# Patient Record
Sex: Female | Born: 1960 | Race: White | Hispanic: No | Marital: Married | Smoking: Never smoker
Health system: Southern US, Community
[De-identification: ages and names within clinical notes are randomized; demographics above are authoritative.]

---

## 1998-01-17 ENCOUNTER — Ambulatory Visit (HOSPITAL_COMMUNITY): Admission: RE | Admit: 1998-01-17 | Discharge: 1998-01-17 | Payer: Self-pay

## 2010-07-22 DIAGNOSIS — I10 Essential (primary) hypertension: Secondary | ICD-10-CM | POA: Insufficient documentation

## 2011-11-06 DIAGNOSIS — F32A Depression, unspecified: Secondary | ICD-10-CM | POA: Insufficient documentation

## 2012-05-26 DIAGNOSIS — E119 Type 2 diabetes mellitus without complications: Secondary | ICD-10-CM | POA: Insufficient documentation

## 2012-11-11 DIAGNOSIS — M1990 Unspecified osteoarthritis, unspecified site: Secondary | ICD-10-CM | POA: Insufficient documentation

## 2012-11-11 DIAGNOSIS — G4733 Obstructive sleep apnea (adult) (pediatric): Secondary | ICD-10-CM | POA: Insufficient documentation

## 2012-11-11 DIAGNOSIS — M797 Fibromyalgia: Secondary | ICD-10-CM | POA: Insufficient documentation

## 2012-11-11 DIAGNOSIS — F411 Generalized anxiety disorder: Secondary | ICD-10-CM | POA: Insufficient documentation

## 2012-11-11 DIAGNOSIS — E785 Hyperlipidemia, unspecified: Secondary | ICD-10-CM | POA: Insufficient documentation

## 2012-11-11 DIAGNOSIS — M519 Unspecified thoracic, thoracolumbar and lumbosacral intervertebral disc disorder: Secondary | ICD-10-CM | POA: Insufficient documentation

## 2012-12-23 DIAGNOSIS — G5600 Carpal tunnel syndrome, unspecified upper limb: Secondary | ICD-10-CM | POA: Insufficient documentation

## 2012-12-23 DIAGNOSIS — M542 Cervicalgia: Secondary | ICD-10-CM | POA: Insufficient documentation

## 2013-01-14 DIAGNOSIS — F410 Panic disorder [episodic paroxysmal anxiety] without agoraphobia: Secondary | ICD-10-CM | POA: Insufficient documentation

## 2013-05-13 DIAGNOSIS — S93609A Unspecified sprain of unspecified foot, initial encounter: Secondary | ICD-10-CM | POA: Insufficient documentation

## 2013-08-10 DIAGNOSIS — M25511 Pain in right shoulder: Secondary | ICD-10-CM | POA: Insufficient documentation

## 2013-08-10 DIAGNOSIS — G43909 Migraine, unspecified, not intractable, without status migrainosus: Secondary | ICD-10-CM | POA: Insufficient documentation

## 2013-08-10 DIAGNOSIS — Z79899 Other long term (current) drug therapy: Secondary | ICD-10-CM | POA: Insufficient documentation

## 2015-06-14 DIAGNOSIS — Z9884 Bariatric surgery status: Secondary | ICD-10-CM | POA: Insufficient documentation

## 2015-08-09 DIAGNOSIS — F339 Major depressive disorder, recurrent, unspecified: Secondary | ICD-10-CM | POA: Insufficient documentation

## 2016-09-17 DIAGNOSIS — R300 Dysuria: Secondary | ICD-10-CM | POA: Insufficient documentation

## 2017-02-27 DIAGNOSIS — Z9884 Bariatric surgery status: Secondary | ICD-10-CM | POA: Insufficient documentation

## 2019-05-26 DIAGNOSIS — Z0289 Encounter for other administrative examinations: Secondary | ICD-10-CM | POA: Insufficient documentation

## 2020-03-27 DIAGNOSIS — N281 Cyst of kidney, acquired: Secondary | ICD-10-CM | POA: Insufficient documentation

## 2020-03-27 DIAGNOSIS — N200 Calculus of kidney: Secondary | ICD-10-CM | POA: Insufficient documentation

## 2020-11-06 DIAGNOSIS — M7551 Bursitis of right shoulder: Secondary | ICD-10-CM | POA: Insufficient documentation

## 2021-02-27 DIAGNOSIS — Z2821 Immunization not carried out because of patient refusal: Secondary | ICD-10-CM | POA: Insufficient documentation

## 2021-07-18 ENCOUNTER — Other Ambulatory Visit: Payer: Self-pay

## 2021-07-18 ENCOUNTER — Telehealth (INDEPENDENT_AMBULATORY_CARE_PROVIDER_SITE_OTHER): Payer: Medicare HMO | Admitting: Family Medicine

## 2021-07-18 DIAGNOSIS — G4700 Insomnia, unspecified: Secondary | ICD-10-CM

## 2021-07-18 DIAGNOSIS — Z7689 Persons encountering health services in other specified circumstances: Secondary | ICD-10-CM | POA: Diagnosis not present

## 2021-07-18 MED ORDER — NORTRIPTYLINE HCL 25 MG PO CAPS: 25.0000 mg | ORAL_CAPSULE | Freq: Every day | ORAL | 1 refills | Status: DC

## 2021-07-18 NOTE — Progress Notes (Signed)
Virtual Visit via Telephone Note  I connected with Sudan Sweetser on 07/18/21 at  1:00 PM EST by telephone and verified that I am speaking with the correct person using two identifiers.  Location: Patient: office Provider: [place]   I discussed the limitations, risks, security and privacy concerns of performing an evaluation and management service by telephone and the availability of in person appointments. I also discussed with the patient that there may be a patient responsible charge related to this service. The patient expressed understanding and agreed to proceed.   History of Present Illness: Patient wants to establish care. Patient also wants refills of nortriptyline  which she takes nightly for sleep.    Observations/Objective:   Assessment and Plan: 1. Insomnia, unspecified type Nortriptyline was refilled.   2. Encounter to establish care   Follow Up Instructions: Patient to schedule for a face-to-face visit.    I discussed the assessment and treatment plan with the patient. The patient was provided an opportunity to ask questions and all were answered. The patient agreed with the plan and demonstrated an understanding of the instructions.   The patient was advised to call back or seek an in-person evaluation if the symptoms worsen or if the condition fails to improve as anticipated.  I provided 5 minutes of non-face-to-face time during this encounter.   Tommie Raymond, MD

## 2021-08-20 ENCOUNTER — Encounter (INDEPENDENT_AMBULATORY_CARE_PROVIDER_SITE_OTHER): Payer: Self-pay

## 2021-08-20 ENCOUNTER — Ambulatory Visit (INDEPENDENT_AMBULATORY_CARE_PROVIDER_SITE_OTHER): Payer: Medicare HMO | Admitting: Family Medicine

## 2021-08-20 ENCOUNTER — Other Ambulatory Visit: Payer: Self-pay

## 2021-08-20 VITALS — BP 138/81 | HR 65 | Temp 99.2°F | Resp 16 | Ht 66.0 in | Wt 250.0 lb

## 2021-08-20 DIAGNOSIS — B349 Viral infection, unspecified: Secondary | ICD-10-CM

## 2021-08-20 DIAGNOSIS — R109 Unspecified abdominal pain: Secondary | ICD-10-CM

## 2021-08-20 DIAGNOSIS — G4733 Obstructive sleep apnea (adult) (pediatric): Secondary | ICD-10-CM | POA: Diagnosis not present

## 2021-08-20 DIAGNOSIS — K219 Gastro-esophageal reflux disease without esophagitis: Secondary | ICD-10-CM

## 2021-08-20 DIAGNOSIS — N23 Unspecified renal colic: Secondary | ICD-10-CM

## 2021-08-20 DIAGNOSIS — I10 Essential (primary) hypertension: Secondary | ICD-10-CM

## 2021-08-20 LAB — POCT URINALYSIS DIP (CLINITEK)
Bilirubin, UA: NEGATIVE
Blood, UA: NEGATIVE
Glucose, UA: NEGATIVE mg/dL
Ketones, POC UA: NEGATIVE mg/dL
Nitrite, UA: NEGATIVE
POC PROTEIN,UA: NEGATIVE
Spec Grav, UA: 1.01 (ref 1.010–1.025)
Urobilinogen, UA: 1 E.U./dL
pH, UA: 6.5 (ref 5.0–8.0)

## 2021-08-20 MED ORDER — DICLOFENAC SODIUM 1 % EX GEL: 2.0000 g | Freq: Four times a day (QID) | CUTANEOUS | 1 refills | Status: DC

## 2021-08-20 MED ORDER — LOSARTAN POTASSIUM 100 MG PO TABS: 100.0000 mg | ORAL_TABLET | Freq: Every day | ORAL | 1 refills | Status: DC

## 2021-08-20 MED ORDER — AMLODIPINE BESYLATE 10 MG PO TABS: 10.0000 mg | ORAL_TABLET | Freq: Every day | ORAL | 1 refills | Status: DC

## 2021-08-20 MED ORDER — METOPROLOL TARTRATE 50 MG PO TABS: 50.0000 mg | ORAL_TABLET | Freq: Two times a day (BID) | ORAL | 1 refills | Status: DC

## 2021-08-20 MED ORDER — OMEPRAZOLE 20 MG PO CPDR: 20.0000 mg | DELAYED_RELEASE_CAPSULE | Freq: Every day | ORAL | 1 refills | Status: DC

## 2021-08-20 NOTE — Progress Notes (Signed)
Patient is here to establish care.  Patient c/o  throat pain,cough, tail bone pain, ear pain, and ear pain 7/10

## 2021-08-20 NOTE — Progress Notes (Signed)
Established Patient Office Visit  Subjective:  Patient ID: Laura Reid, female    DOB: 01/15/1961  Age: 61 y.o. MRN: [id number]  CC:  Chief Complaint  Patient presents with   Establish Care    HPI [facility] presents for complaint of cough, congestion and various viral sx. Denies known contacts or exposures. Patient also is in for follow up of chronic med issues with med refills.   No past medical history on file.  No family history on file.  Social History   Socioeconomic History   Marital status: Unknown    Spouse name: Not on file   Number of children: Not on file   Years of education: Not on file   Highest education level: Not on file  Occupational History   Not on file  Tobacco Use   Smoking status: Not on file   Smokeless tobacco: Not on file  Substance and Sexual Activity   Alcohol use: Not on file   Drug use: Not on file   Sexual activity: Not on file  Other Topics Concern   Not on file  Social History Narrative   Not on file   Social Determinants of Health   Financial Resource Strain: Not on file  Food Insecurity: Not on file  Transportation Needs: Not on file  Physical Activity: Not on file  Stress: Not on file  Social Connections: Not on file  Intimate Partner Violence: Not on file    ROS Review of Systems  Constitutional:  Positive for fatigue and fever. Negative for chills.  HENT:  Positive for congestion, ear pain and rhinorrhea.   Respiratory:  Positive for cough. Negative for shortness of breath.   Neurological:  Positive for headaches.  All other systems reviewed and are negative.  Objective:   Today's Vitals: BP 138/81    Pulse 65    Temp 99.2 F (37.3 C) (Oral)    Resp 16    Ht 5\' 6"  (1.676 m)    Wt 250 lb (113.4 kg)    SpO2 95%    BMI 40.35 kg/m   Physical Exam Vitals and nursing note reviewed.  Constitutional:      General: She is not in acute distress.    Appearance: She is obese.  HENT:     Head: Normocephalic.      Right Ear: Tympanic membrane normal.     Left Ear: Tympanic membrane normal.     Mouth/Throat:     Mouth: Mucous membranes are moist.  Eyes:     Conjunctiva/sclera: Conjunctivae normal.  Cardiovascular:     Rate and Rhythm: Normal rate and regular rhythm.  Pulmonary:     Effort: Pulmonary effort is normal.     Breath sounds: Normal breath sounds.  Abdominal:     Palpations: Abdomen is soft.     Tenderness: There is no abdominal tenderness.  Musculoskeletal:     Right lower leg: No edema.     Left lower leg: No edema.  Neurological:     General: No focal deficit present.     Mental Status: She is alert and oriented to person, place, and time.    Assessment & Plan:   1. Viral syndrome Results of viral testing pending.   - COVID-19, Flu A+B and RSV  2. Essential hypertension Continue present management and monitor. Meds refilled.   - amLODipine (NORVASC) 10 MG tablet; Take 1 tablet (10 mg total) by mouth daily.  Dispense: 90 tablet; Refill: 1 - losartan (COZAAR)  100 MG tablet; Take 1 tablet (100 mg total) by mouth daily.  Dispense: 90 tablet; Refill: 1 - metoprolol tartrate (LOPRESSOR) 50 MG tablet; Take 1 tablet (50 mg total) by mouth 2 (two) times daily.  Dispense: 180 tablet; Refill: 1  3. Gastroesophageal reflux disease without esophagitis Continue present management. Meds refilled.  - omeprazole (PRILOSEC) 20 MG capsule; Take 1 capsule (20 mg total) by mouth daily.  Dispense: 90 capsule; Refill: 1  4. OSA (obstructive sleep apnea) Patient given form for change in cpap setting.   - For home use only DME continuous positive airway pressure (CPAP)  5. Flank pain Urine clinically unremarkable. Recommend adequate fluids .  - POCT URINALYSIS DIP (CLINITEK)    Outpatient Encounter Medications as of 08/20/2021  Medication Sig   albuterol (VENTOLIN HFA) 108 (90 Base) MCG/ACT inhaler Inhale into the lungs.   gabapentin (NEURONTIN) 300 MG capsule Take by mouth.    Multiple Vitamin (MULTI-VITAMINS) TABS Take 1 tablet by mouth daily.   nortriptyline (PAMELOR) 25 MG capsule Take 1 capsule (25 mg total) by mouth at bedtime.   ondansetron (ZOFRAN-ODT) 4 MG disintegrating tablet Take by mouth.   oxyCODONE (ROXICODONE) 5 MG/5ML solution Take by mouth.   PARoxetine (PAXIL) 30 MG tablet Take by mouth.   tiZANidine (ZANAFLEX) 4 MG tablet Take 1/2 to 1 tablet up to three times daily as needed for muscle spasms   [DISCONTINUED] amLODipine (NORVASC) 10 MG tablet Take 1 tablet by mouth daily.   [DISCONTINUED] diclofenac Sodium (VOLTAREN) 1 % GEL Apply topically.   [DISCONTINUED] losartan (COZAAR) 100 MG tablet Take 1 tablet by mouth daily.   [DISCONTINUED] metoprolol tartrate (LOPRESSOR) 50 MG tablet Take by mouth.   [DISCONTINUED] omeprazole (PRILOSEC) 20 MG capsule Take 1 tablet by mouth daily.   amLODipine (NORVASC) 10 MG tablet Take 1 tablet (10 mg total) by mouth daily.   diclofenac Sodium (VOLTAREN) 1 % GEL Apply 2 g topically 4 (four) times daily.   losartan (COZAAR) 100 MG tablet Take 1 tablet (100 mg total) by mouth daily.   metoprolol tartrate (LOPRESSOR) 50 MG tablet Take 1 tablet (50 mg total) by mouth 2 (two) times daily.   omeprazole (PRILOSEC) 20 MG capsule Take 1 capsule (20 mg total) by mouth daily.   No facility-administered encounter medications on file as of 08/20/2021.    Follow-up: No follow-ups on file.   Tommie Raymond, MD

## 2021-08-21 ENCOUNTER — Encounter: Payer: Self-pay | Admitting: Family Medicine

## 2021-08-21 LAB — COVID-19, FLU A+B AND RSV
Influenza A, NAA: NOT DETECTED
Influenza B, NAA: NOT DETECTED
RSV, NAA: NOT DETECTED
SARS-CoV-2, NAA: DETECTED — AB

## 2021-08-22 ENCOUNTER — Other Ambulatory Visit: Payer: Self-pay | Admitting: Family Medicine

## 2021-08-22 MED ORDER — NIRMATRELVIR/RITONAVIR (PAXLOVID) TABLET (RENAL DOSING): 2.0000 | ORAL_TABLET | Freq: Two times a day (BID) | ORAL | 0 refills | Status: AC

## 2021-08-28 ENCOUNTER — Ambulatory Visit: Payer: Self-pay | Admitting: Family Medicine

## 2021-09-26 ENCOUNTER — Other Ambulatory Visit: Payer: Self-pay

## 2021-09-26 ENCOUNTER — Encounter: Payer: Self-pay | Admitting: Family Medicine

## 2021-09-26 ENCOUNTER — Ambulatory Visit (INDEPENDENT_AMBULATORY_CARE_PROVIDER_SITE_OTHER): Payer: Medicare HMO | Admitting: Family Medicine

## 2021-09-26 VITALS — BP 138/88 | HR 64 | Temp 98.1°F | Resp 16 | Wt 254.0 lb

## 2021-09-26 DIAGNOSIS — B349 Viral infection, unspecified: Secondary | ICD-10-CM | POA: Diagnosis not present

## 2021-09-26 DIAGNOSIS — R0789 Other chest pain: Secondary | ICD-10-CM | POA: Diagnosis not present

## 2021-09-26 DIAGNOSIS — K219 Gastro-esophageal reflux disease without esophagitis: Secondary | ICD-10-CM | POA: Diagnosis not present

## 2021-09-26 MED ORDER — OMEPRAZOLE 20 MG PO CPDR: 20.0000 mg | DELAYED_RELEASE_CAPSULE | Freq: Every day | ORAL | 1 refills | Status: DC

## 2021-09-26 MED ORDER — ALBUTEROL SULFATE HFA 108 (90 BASE) MCG/ACT IN AERS: 2.0000 | INHALATION_SPRAY | Freq: Four times a day (QID) | RESPIRATORY_TRACT | 3 refills | Status: DC | PRN

## 2021-09-26 MED ORDER — HYDROCHLOROTHIAZIDE 25 MG PO TABS: 25.0000 mg | ORAL_TABLET | Freq: Every day | ORAL | Status: DC

## 2021-09-26 NOTE — Progress Notes (Signed)
Patient is here for BP issues and a lingering cough since COVID  last month Patient had vomiting and wicked headache  Patient has chest pressure 1 day. EKG done

## 2021-09-26 NOTE — Progress Notes (Signed)
Established Patient Office Visit  Subjective:  Patient ID: Laura Reid, female    DOB: [birth year]  Age: 61 y.o. MRN: [id number]  CC:  Chief Complaint  Patient presents with   Sore Throat   Chest Pain    HPI Laura Reid presents for complaint of several symptoms similar to when she had covid last month including headache and cough. She denies fever but does report chills. She denies known contacts or exposures.   No past medical history on file.  No past surgical history on file.  No family history on file.  Social History   Socioeconomic History   Marital status: Married    Spouse name: Not on file   Number of children: Not on file   Years of education: Not on file   Highest education level: Not on file  Occupational History   Not on file  Tobacco Use   Smoking status: Not on file   Smokeless tobacco: Not on file  Substance and Sexual Activity   Alcohol use: Not on file   Drug use: Not on file   Sexual activity: Not on file  Other Topics Concern   Not on file  Social History Narrative   Not on file   Social Determinants of Health   Financial Resource Strain: Not on file  Food Insecurity: Not on file  Transportation Needs: Not on file  Physical Activity: Not on file  Stress: Not on file  Social Connections: Not on file  Intimate Partner Violence: Not on file    ROS Review of Systems  Constitutional:  Positive for chills. Negative for fever.  HENT:  Positive for sore throat.   Gastrointestinal:  Positive for nausea and vomiting.  Neurological:  Positive for headaches.  All other systems reviewed and are negative.  Objective:   Today's Vitals: BP 138/88    Pulse 64    Temp 98.1 F (36.7 C) (Oral)    Resp 16    Wt 254 lb (115.2 kg)    SpO2 96%    BMI 41.00 kg/m   Physical Exam Vitals and nursing note reviewed.  Constitutional:      General: She is not in acute distress.    Appearance: She is obese.  HENT:     Head: Normocephalic.      Right Ear: Tympanic membrane normal.     Left Ear: Tympanic membrane normal.     Mouth/Throat:     Mouth: Mucous membranes are moist.  Eyes:     Conjunctiva/sclera: Conjunctivae normal.  Cardiovascular:     Rate and Rhythm: Normal rate and regular rhythm.  Pulmonary:     Effort: Pulmonary effort is normal.     Breath sounds: Normal breath sounds.  Abdominal:     Palpations: Abdomen is soft.     Tenderness: There is no abdominal tenderness.  Musculoskeletal:     Right lower leg: No edema.     Left lower leg: No edema.  Neurological:     General: No focal deficit present.     Mental Status: She is alert and oriented to person, place, and time.    Assessment & Plan:   1. Viral syndrome Viral testing ordered - COVID-19, Flu A+B and RSV  2. Chest pressure EKG  with acute changes - EKG 12-Lead  3. Gastroesophageal reflux disease without esophagitis Meds refilled - omeprazole (PRILOSEC) 20 MG capsule; Take 1 capsule (20 mg total) by mouth daily.  Dispense: 90 capsule; Refill: 1  Outpatient Encounter Medications as of 09/26/2021  Medication Sig   albuterol (VENTOLIN HFA) 108 (90 Base) MCG/ACT inhaler Inhale into the lungs.   amLODipine (NORVASC) 10 MG tablet Take 1 tablet (10 mg total) by mouth daily.   diclofenac Sodium (VOLTAREN) 1 % GEL Apply 2 g topically 4 (four) times daily.   gabapentin (NEURONTIN) 300 MG capsule Take by mouth.   losartan (COZAAR) 100 MG tablet Take 1 tablet (100 mg total) by mouth daily.   metoprolol tartrate (LOPRESSOR) 50 MG tablet Take 1 tablet (50 mg total) by mouth 2 (two) times daily.   Multiple Vitamin (MULTI-VITAMINS) TABS Take 1 tablet by mouth daily.   nortriptyline (PAMELOR) 25 MG capsule Take 1 capsule (25 mg total) by mouth at bedtime.   omeprazole (PRILOSEC) 20 MG capsule Take 1 capsule (20 mg total) by mouth daily.   ondansetron (ZOFRAN-ODT) 4 MG disintegrating tablet Take by mouth.   oxyCODONE (ROXICODONE) 5 MG/5ML solution Take by  mouth.   PARoxetine (PAXIL) 30 MG tablet Take by mouth.   tiZANidine (ZANAFLEX) 4 MG tablet Take 1/2 to 1 tablet up to three times daily as needed for muscle spasms   No facility-administered encounter medications on file as of 09/26/2021.    Follow-up: No follow-ups on file.   Tommie Raymond, MD

## 2021-09-27 ENCOUNTER — Encounter: Payer: Self-pay | Admitting: Family Medicine

## 2021-09-27 LAB — COVID-19, FLU A+B AND RSV
Influenza A, NAA: NOT DETECTED
Influenza B, NAA: NOT DETECTED
RSV, NAA: NOT DETECTED
SARS-CoV-2, NAA: NOT DETECTED

## 2021-10-01 NOTE — Telephone Encounter (Signed)
Patient is aware of lab results.

## 2021-11-25 ENCOUNTER — Ambulatory Visit: Payer: Self-pay

## 2021-11-25 ENCOUNTER — Telehealth: Payer: Self-pay | Admitting: Family Medicine

## 2021-11-25 DIAGNOSIS — I10 Essential (primary) hypertension: Secondary | ICD-10-CM

## 2021-11-25 MED ORDER — METOPROLOL TARTRATE 50 MG PO TABS: 50.0000 mg | ORAL_TABLET | Freq: Two times a day (BID) | ORAL | 0 refills | Status: DC

## 2021-11-25 NOTE — Telephone Encounter (Signed)
?  Chief Complaint: med refill ?Symptoms: NA ?Frequency: NA ?Pertinent Negatives: NA ?Disposition: [] ED /[] Urgent Care (no appt availability in office) / [] Appointment(In office/virtual)/ []  [place] [facility]/ [] Home Care/ [] Refused Recommended Disposition /[] [facility]/ [x]  Follow-up with PCP ?Additional Notes: spoke with pt's husband. He states pt is needing refill on Paxil and metoprolol. I advised him I would send both of these medications to Dr. . I advised him someone should f/up tomorrow regarding meds being refilled. He verbalized understanding.  ? ?Summary: Refill Advice not with Dr  ? Pts husband is calling to request Dr. to refill the pts PARoxetine (PAXIL) 30 MG tablet - However Dr. is not the original prescriber. An appt is Scheduled for 01/02/22. Pt reports only has 3 pills left. Husband would like to know how the patient can receive more medication please advise?   ?  ? ?Reason for Disposition ? [1] Prescription refill request for NON-ESSENTIAL medicine (i.e., no harm to patient if med not taken) AND [2] triager unable to refill per department policy ? ?Answer Assessment - Initial Assessment Questions ?1. DRUG NAME: "What medicine do you need to have refilled?" ?    Paxil 30mg  and Metoprolol 50mg  ?2. REFILLS REMAINING: "How many refills are remaining?" (Note: The label on the medicine or pill bottle will show how many refills are remaining. If there are no refills remaining, then a renewal may be needed.) ?    0 ?4. PRESCRIBING HCP: "Who prescribed it?" Reason: If prescribed by specialist, call should be referred to that group. ?    Dr. Andrey Campanile ? ?Protocols used: Medication Refill and Renewal Call-A-AH ? ?

## 2021-11-25 NOTE — Telephone Encounter (Signed)
Requested Prescriptions  ?Pending Prescriptions Disp Refills  ?? metoprolol tartrate (LOPRESSOR) 50 MG tablet 180 tablet 1  ?  Sig: Take 1 tablet (50 mg total) by mouth 2 (two) times daily.  ?  ? Cardiovascular:  Beta Blockers Passed - 11/25/2021  5:50 PM  ?  ?  Passed - Last BP in normal range  ?  BP Readings from Last 1 Encounters:  ?09/26/21 138/88  ?   ?  ?  Passed - Last Heart Rate in normal range  ?  Pulse Readings from Last 1 Encounters:  ?09/26/21 64  ?   ?  ?  Passed - Valid encounter within last 6 months  ?  Recent Outpatient Visits   ?      ? 2 months ago Viral syndrome  ? Primary Care at [facility], [place]  ? 3 months ago Viral syndrome  ? Primary Care at [facility], MD  ? 4 months ago Insomnia, unspecified type  ? Primary Care at [facility], MD  ?  ?  ?Future Appointments   ?        ? In 1 month Georganna Skeans, MD Primary Care at [facility]  ?  ? ?  ?  ?  ?? PARoxetine (PAXIL) 30 MG tablet    ?  Sig: Take by mouth.  ?  ? Psychiatry:  Antidepressants - SSRI Passed - 11/25/2021  5:50 PM  ?  ?  Passed - Valid encounter within last 6 months  ?  Recent Outpatient Visits   ?      ? 2 months ago Viral syndrome  ? Primary Care at [facility], [place]  ? 3 months ago Viral syndrome  ? Primary Care at [facility], MD  ? 4 months ago Insomnia, unspecified type  ? Primary Care at [facility], MD  ?  ?  ?Future Appointments   ?        ? In 1 month Georganna Skeans, MD Primary Care at [facility]  ?  ? ?  ?  ?  ? ? ?

## 2021-11-25 NOTE — Telephone Encounter (Signed)
Requested medication (s) are due for refill today: yes ? ?Requested medication (s) are on the active medication list: yes ? ?Last refill:  07/19/20 ? ?Future visit scheduled: yes ? ?Notes to clinic:  historical med. pt requesting refill. She only has 2 pills left. Thought this med was prescribed by Dr. Andrey Campanile previously.  ? ? ?  ?Requested Prescriptions  ?Pending Prescriptions Disp Refills  ? PARoxetine (PAXIL) 30 MG tablet    ?  Sig: Take by mouth.  ?  ? Psychiatry:  Antidepressants - SSRI Passed - 11/25/2021  5:50 PM  ?  ?  Passed - Valid encounter within last 6 months  ?  Recent Outpatient Visits   ? ?      ? 2 months ago Viral syndrome  ? Primary Care at [facility], MD  ? 3 months ago Viral syndrome  ? Primary Care at [facility], [place]  ? 4 months ago Insomnia, unspecified type  ? Primary Care at [facility], MD  ? ?  ?  ?Future Appointments   ? ?        ? In 1 month Georganna Skeans, MD Primary Care at [facility]  ? ?  ? ? ?  ?  ?  ?Signed Prescriptions Disp Refills  ? metoprolol tartrate (LOPRESSOR) 50 MG tablet 180 tablet 0  ?  Sig: Take 1 tablet (50 mg total) by mouth 2 (two) times daily.  ?  ? Cardiovascular:  Beta Blockers Passed - 11/25/2021  5:50 PM  ?  ?  Passed - Last BP in normal range  ?  BP Readings from Last 1 Encounters:  ?09/26/21 138/88  ?  ?  ?  ?  Passed - Last Heart Rate in normal range  ?  Pulse Readings from Last 1 Encounters:  ?09/26/21 64  ?  ?  ?  ?  Passed - Valid encounter within last 6 months  ?  Recent Outpatient Visits   ? ?      ? 2 months ago Viral syndrome  ? Primary Care at [facility], MD  ? 3 months ago Viral syndrome  ? Primary Care at [facility], MD  ? 4 months ago Insomnia, unspecified type  ? Primary Care at [facility], MD  ? ?  ?  ?Future Appointments   ? ?        ? In 1 month Georganna Skeans, MD Primary Care at [facility]  ? ?  ? ? ?  ?  ?  ? ?

## 2021-11-25 NOTE — Telephone Encounter (Signed)
Medication Refill - Medication: metoprolol tartrate (LOPRESSOR) 50 MG tablet [[id number]] ,  ? ?Has the patient contacted their pharmacy? Yes.   ?(Agent: If no, request that the patient contact the pharmacy for the refill. If patient does not wish to contact the pharmacy document the reason why and proceed with request.) ?(Agent: If yes, when and what did the pharmacy advise?) ? ?Preferred Pharmacy (with phone number or street name):  ?[id number] - [place], [place] - [address]  ?[address] [place] [address]  ?Phone: [phone] Fax: [phone]  ?Hours: Not open 24 hours  ? ? ? ?Has the patient been seen for an appointment in the last year OR does the patient have an upcoming appointment? Yes.   ? ?Agent: Please be advised that RX refills may take up to 3 business days. We ask that you follow-up with your pharmacy. ? ?

## 2021-11-26 ENCOUNTER — Other Ambulatory Visit: Payer: Self-pay | Admitting: *Deleted

## 2021-11-29 ENCOUNTER — Other Ambulatory Visit: Payer: Self-pay | Admitting: Family Medicine

## 2021-12-02 ENCOUNTER — Other Ambulatory Visit: Payer: Self-pay | Admitting: Family Medicine

## 2021-12-02 NOTE — Telephone Encounter (Signed)
Pt stated this was the wrong dose, pt stated she is supposed to get 100MG  tablets, extended release, please advise.  ?

## 2021-12-04 ENCOUNTER — Other Ambulatory Visit: Payer: Self-pay | Admitting: Family Medicine

## 2021-12-04 DIAGNOSIS — I10 Essential (primary) hypertension: Secondary | ICD-10-CM

## 2021-12-04 MED ORDER — METOPROLOL SUCCINATE ER 100 MG PO TB24: 100.0000 mg | ORAL_TABLET | Freq: Every day | ORAL | 1 refills | Status: DC

## 2021-12-11 ENCOUNTER — Encounter: Payer: Self-pay | Admitting: Family Medicine

## 2021-12-11 ENCOUNTER — Telehealth: Payer: Self-pay | Admitting: Family Medicine

## 2021-12-11 ENCOUNTER — Other Ambulatory Visit: Payer: Self-pay | Admitting: Family Medicine

## 2021-12-11 MED ORDER — PAROXETINE HCL 30 MG PO TABS: 30.0000 mg | ORAL_TABLET | Freq: Every day | ORAL | 0 refills | Status: DC

## 2021-12-11 NOTE — Telephone Encounter (Signed)
I had called pt to r/s her appt on the afternoon of May 25. Moved it to the morning of same day. She said that she is out of her Paxil and has already requested a refill. She was a prior pt of Dr. Tawana Scale in [facility] and is unsure why there are notations that Dr. Andrey Campanile never filled this for her. Please refill until she is seen on May 25.  [facility]. ?

## 2021-12-18 ENCOUNTER — Telehealth: Payer: Self-pay | Admitting: Family Medicine

## 2021-12-18 NOTE — Telephone Encounter (Signed)
Pt is suppose to be taking 60 MG and not 30mg / PARoxetine (PAXIL) 30 MG tablet /pt was only sent 30 tabs to get her to her 5.25.23 appt / pt needs enough to get her to that date and did not received 26  pills from the pharmacy since the pharmacy gave her a few as courtesy while waiting for refill / please advise  ?

## 2021-12-19 ENCOUNTER — Other Ambulatory Visit: Payer: Self-pay | Admitting: Family Medicine

## 2021-12-19 MED ORDER — PAROXETINE HCL 30 MG PO TABS: 60.0000 mg | ORAL_TABLET | Freq: Every day | ORAL | 1 refills | Status: DC

## 2021-12-23 ENCOUNTER — Encounter: Payer: Self-pay | Admitting: Family Medicine

## 2022-01-02 ENCOUNTER — Ambulatory Visit (INDEPENDENT_AMBULATORY_CARE_PROVIDER_SITE_OTHER): Payer: Medicare HMO | Admitting: Family Medicine

## 2022-01-02 ENCOUNTER — Encounter: Payer: Self-pay | Admitting: Family Medicine

## 2022-01-02 VITALS — BP 137/80 | HR 57 | Temp 98.0°F | Resp 16 | Wt 255.1 lb

## 2022-01-02 DIAGNOSIS — J4541 Moderate persistent asthma with (acute) exacerbation: Secondary | ICD-10-CM

## 2022-01-02 DIAGNOSIS — I10 Essential (primary) hypertension: Secondary | ICD-10-CM

## 2022-01-02 DIAGNOSIS — K219 Gastro-esophageal reflux disease without esophagitis: Secondary | ICD-10-CM

## 2022-01-02 DIAGNOSIS — R519 Headache, unspecified: Secondary | ICD-10-CM | POA: Diagnosis not present

## 2022-01-02 DIAGNOSIS — R296 Repeated falls: Secondary | ICD-10-CM

## 2022-01-02 MED ORDER — AMLODIPINE BESYLATE 10 MG PO TABS: 10.0000 mg | ORAL_TABLET | Freq: Every day | ORAL | 1 refills | Status: DC

## 2022-01-02 MED ORDER — METOPROLOL SUCCINATE ER 100 MG PO TB24: 100.0000 mg | ORAL_TABLET | Freq: Every day | ORAL | 1 refills | Status: DC

## 2022-01-02 MED ORDER — GABAPENTIN 300 MG PO CAPS: 300.0000 mg | ORAL_CAPSULE | Freq: Three times a day (TID) | ORAL | 1 refills | Status: DC

## 2022-01-02 MED ORDER — PAROXETINE HCL 30 MG PO TABS: 60.0000 mg | ORAL_TABLET | Freq: Every day | ORAL | 1 refills | Status: DC

## 2022-01-02 MED ORDER — LOSARTAN POTASSIUM 100 MG PO TABS: 100.0000 mg | ORAL_TABLET | Freq: Every day | ORAL | 1 refills | Status: DC

## 2022-01-02 MED ORDER — OMEPRAZOLE 20 MG PO CPDR: 20.0000 mg | DELAYED_RELEASE_CAPSULE | Freq: Every day | ORAL | 1 refills | Status: DC

## 2022-01-02 MED ORDER — HYDROCHLOROTHIAZIDE 25 MG PO TABS
25.0000 mg | ORAL_TABLET | Freq: Every day | ORAL | Status: DC
Start: 2022-01-02 — End: 2022-04-09

## 2022-01-02 MED ORDER — NORTRIPTYLINE HCL 25 MG PO CAPS: 25.0000 mg | ORAL_CAPSULE | Freq: Every day | ORAL | 1 refills | Status: DC

## 2022-01-02 NOTE — Progress Notes (Signed)
Established Patient Office Visit  Subjective    Patient ID: Laura Reid, female    DOB: 1961/06/11  Age: 61 y.o. MRN: [id number]  CC:  Chief Complaint  Patient presents with   Medication Refill    HPI Laura Reid presents for routine follow up of chronic med issues.Patient reports that she has been falling and feeling off balance recently. She also has been having headaches that are more frequent and severe.  Patient is also requesting documentation of her canine as an emotional support animal.    Outpatient Encounter Medications as of 01/02/2022  Medication Sig   albuterol (VENTOLIN HFA) 108 (90 Base) MCG/ACT inhaler Inhale 2 puffs into the lungs every 6 (six) hours as needed for wheezing or shortness of breath.   diclofenac Sodium (VOLTAREN) 1 % GEL Apply 2 g topically 4 (four) times daily.   promethazine (PHENERGAN) 25 MG tablet Take by mouth.   tiZANidine (ZANAFLEX) 4 MG tablet Take 1/2 to 1 tablet up to three times daily as needed for muscle spasms   [DISCONTINUED] amLODipine (NORVASC) 10 MG tablet Take 1 tablet (10 mg total) by mouth daily.   [DISCONTINUED] gabapentin (NEURONTIN) 300 MG capsule Take by mouth.   [DISCONTINUED] gabapentin (NEURONTIN) 300 MG capsule Take by mouth.   [DISCONTINUED] hydrochlorothiazide (HYDRODIURIL) 25 MG tablet Take 1 tablet (25 mg total) by mouth daily.   [DISCONTINUED] losartan (COZAAR) 100 MG tablet Take 1 tablet (100 mg total) by mouth daily.   [DISCONTINUED] metoprolol succinate (TOPROL-XL) 100 MG 24 hr tablet Take 1 tablet (100 mg total) by mouth daily. Take with or immediately following a meal.   [DISCONTINUED] metoprolol tartrate (LOPRESSOR) 50 MG tablet Take 1 tablet (50 mg total) by mouth 2 (two) times daily.   [DISCONTINUED] Multiple Vitamin (MULTI-VITAMINS) TABS Take 1 tablet by mouth daily.   [DISCONTINUED] nortriptyline (PAMELOR) 25 MG capsule Take 1 capsule (25 mg total) by mouth at bedtime.   [DISCONTINUED] omeprazole  (PRILOSEC) 20 MG capsule Take 1 capsule (20 mg total) by mouth daily.   [DISCONTINUED] PARoxetine (PAXIL) 30 MG tablet Take 2 tablets (60 mg total) by mouth daily.   ALPRAZolam (XANAX) 0.5 MG tablet Take by mouth.   amLODipine (NORVASC) 10 MG tablet Take 1 tablet (10 mg total) by mouth daily.   gabapentin (NEURONTIN) 300 MG capsule Take 1 capsule (300 mg total) by mouth 3 (three) times daily.   hydrochlorothiazide (HYDRODIURIL) 25 MG tablet Take 1 tablet (25 mg total) by mouth daily.   losartan (COZAAR) 100 MG tablet Take 1 tablet (100 mg total) by mouth daily.   metoprolol succinate (TOPROL-XL) 100 MG 24 hr tablet Take 1 tablet (100 mg total) by mouth daily. Take with or immediately following a meal.   Multiple Vitamins-Minerals (THERA-M) TABS Take 1 tablet by mouth daily.   nortriptyline (PAMELOR) 25 MG capsule Take 1 capsule (25 mg total) by mouth at bedtime.   omeprazole (PRILOSEC) 20 MG capsule Take 1 capsule (20 mg total) by mouth daily.   oxyCODONE-acetaminophen (PERCOCET) 10-325 MG tablet Take 1 tablet by mouth 4 (four) times daily as needed.   PARoxetine (PAXIL) 30 MG tablet Take 2 tablets (60 mg total) by mouth daily.   [DISCONTINUED] acetaminophen (TYLENOL) 160 MG/5ML suspension Take by mouth.   [DISCONTINUED] ondansetron (ZOFRAN-ODT) 4 MG disintegrating tablet Take by mouth.   [DISCONTINUED] oxyCODONE (ROXICODONE) 5 MG/5ML solution Take by mouth.   No facility-administered encounter medications on file as of 01/02/2022.    History reviewed. No pertinent  past medical history.  History reviewed. No pertinent surgical history.  History reviewed. No pertinent family history.  Social History   Socioeconomic History   Marital status: Married    Spouse name: Not on file   Number of children: Not on file   Years of education: Not on file   Highest education level: Not on file  Occupational History   Not on file  Tobacco Use   Smoking status: Not on file   Smokeless tobacco:  Not on file  Substance and Sexual Activity   Alcohol use: Not on file   Drug use: Not on file   Sexual activity: Not on file  Other Topics Concern   Not on file  Social History Narrative   Not on file   Social Determinants of Health   Financial Resource Strain: Not on file  Food Insecurity: Not on file  Transportation Needs: Not on file  Physical Activity: Not on file  Stress: Not on file  Social Connections: Not on file  Intimate Partner Violence: Not on file    Review of Systems  Musculoskeletal:  Positive for falls.  Neurological:  Positive for speech change and headaches. Negative for seizures and loss of consciousness.  All other systems reviewed and are negative.      Objective    BP 137/80 (BP Location: Right Arm, Cuff Size: Large)   Pulse (!) 57   Temp 98 F (36.7 C) (Oral)   Resp 16   Wt 255 lb 1.6 oz (115.7 kg)   SpO2 98%   BMI 41.17 kg/m   Physical Exam Vitals and nursing note reviewed.  Constitutional:      General: She is not in acute distress.    Appearance: She is obese.  Cardiovascular:     Rate and Rhythm: Normal rate and regular rhythm.  Pulmonary:     Effort: Pulmonary effort is normal.     Breath sounds: Normal breath sounds.  Abdominal:     Palpations: Abdomen is soft.     Tenderness: There is no abdominal tenderness.  Neurological:     General: No focal deficit present.     Mental Status: She is alert and oriented to person, place, and time.  Psychiatric:        Mood and Affect: Mood normal.        Behavior: Behavior normal.        Assessment & Plan:   1. Falls frequently Referral to neuro for further eval/mgt. Refraal for MRI - Ambulatory referral to Neurology - MR Brain W Wo Contrast; Future  2. Nonintractable headache, unspecified chronicity pattern, unspecified headache type As above - Ambulatory referral to Neurology - MR Brain W Wo Contrast; Future  3. Moderate persistent asthmatic bronchitis with acute  exacerbation Improving.   4. Essential hypertension Appears stable with present management . Continue and monitor - amLODipine (NORVASC) 10 MG tablet; Take 1 tablet (10 mg total) by mouth daily.  Dispense: 90 tablet; Refill: 1 - losartan (COZAAR) 100 MG tablet; Take 1 tablet (100 mg total) by mouth daily.  Dispense: 90 tablet; Refill: 1  5. Gastroesophageal reflux disease without esophagitis Continue present management.  - omeprazole (PRILOSEC) 20 MG capsule; Take 1 capsule (20 mg total) by mouth daily.  Dispense: 90 capsule; Refill: 1    Return in about 3 months (around 04/04/2022) for follow up.   Tommie Raymond, MD

## 2022-01-02 NOTE — Progress Notes (Signed)
Patient is her for her 3 month follow-up. Patient would also like to go over herr medication

## 2022-01-20 ENCOUNTER — Other Ambulatory Visit (HOSPITAL_COMMUNITY): Payer: Self-pay | Admitting: Anesthesiology

## 2022-01-20 ENCOUNTER — Other Ambulatory Visit: Payer: Self-pay | Admitting: Anesthesiology

## 2022-01-20 DIAGNOSIS — R2689 Other abnormalities of gait and mobility: Secondary | ICD-10-CM

## 2022-01-20 DIAGNOSIS — G629 Polyneuropathy, unspecified: Secondary | ICD-10-CM

## 2022-01-24 ENCOUNTER — Ambulatory Visit (INDEPENDENT_AMBULATORY_CARE_PROVIDER_SITE_OTHER): Payer: Medicare HMO

## 2022-01-24 ENCOUNTER — Telehealth: Payer: Self-pay | Admitting: *Deleted

## 2022-01-24 DIAGNOSIS — Z0000 Encounter for general adult medical examination without abnormal findings: Secondary | ICD-10-CM

## 2022-01-24 NOTE — Patient Instructions (Signed)

## 2022-01-24 NOTE — Telephone Encounter (Signed)
Approval  code or MRI [id number] 08/23/2021-07/23/2022

## 2022-01-24 NOTE — Progress Notes (Signed)
Subjective:   Laura Reid is a 61 y.o. female who presents for an Initial Medicare Annual Wellness Visit.  I connected with  Laura Reid on 01/24/22 by a video and audio enabled telemedicine application and verified that I am speaking with the correct person using two identifiers.  Patient Location: Home  Provider Location: Home Office  I discussed the limitations of evaluation and management by telemedicine. The patient expressed understanding and agreed to proceed.     Objective:    Today's Vitals   01/24/22 1224  PainSc: 7    There is no height or weight on file to calculate BMI.      No data to display          Current Medications (verified) Outpatient Encounter Medications as of 01/24/2022  Medication Sig   albuterol (VENTOLIN HFA) 108 (90 Base) MCG/ACT inhaler Inhale 2 puffs into the lungs every 6 (six) hours as needed for wheezing or shortness of breath.   ALPRAZolam (XANAX) 0.5 MG tablet Take by mouth.   amLODipine (NORVASC) 10 MG tablet Take 1 tablet (10 mg total) by mouth daily.   diclofenac Sodium (VOLTAREN) 1 % GEL Apply 2 g topically 4 (four) times daily.   gabapentin (NEURONTIN) 300 MG capsule Take 1 capsule (300 mg total) by mouth 3 (three) times daily.   hydrochlorothiazide (HYDRODIURIL) 25 MG tablet Take 1 tablet (25 mg total) by mouth daily.   losartan (COZAAR) 100 MG tablet Take 1 tablet (100 mg total) by mouth daily.   metoprolol succinate (TOPROL-XL) 100 MG 24 hr tablet Take 1 tablet (100 mg total) by mouth daily. Take with or immediately following a meal.   Multiple Vitamins-Minerals (THERA-M) TABS Take 1 tablet by mouth daily.   nortriptyline (PAMELOR) 25 MG capsule Take 1 capsule (25 mg total) by mouth at bedtime.   omeprazole (PRILOSEC) 20 MG capsule Take 1 capsule (20 mg total) by mouth daily.   oxyCODONE-acetaminophen (PERCOCET) 10-325 MG tablet Take 1 tablet by mouth 4 (four) times daily as needed.   PARoxetine (PAXIL) 30 MG tablet Take  2 tablets (60 mg total) by mouth daily.   promethazine (PHENERGAN) 25 MG tablet Take by mouth.   tiZANidine (ZANAFLEX) 4 MG tablet Take 1/2 to 1 tablet up to three times daily as needed for muscle spasms   No facility-administered encounter medications on file as of 01/24/2022.    Allergies (verified) Milnacipran, Duloxetine, Clindamycin, and Pregabalin   History: No past medical history on file. No past surgical history on file. No family history on file. Social History   Socioeconomic History   Marital status: Married    Spouse name: Not on file   Number of children: Not on file   Years of education: Not on file   Highest education level: Not on file  Occupational History   Not on file  Tobacco Use   Smoking status: Not on file   Smokeless tobacco: Not on file  Substance and Sexual Activity   Alcohol use: Not on file   Drug use: Not on file   Sexual activity: Not on file  Other Topics Concern   Not on file  Social History Narrative   Not on file   Social Determinants of Health   Financial Resource Strain: Low Risk  (01/24/2022)   Overall Financial Resource Strain (CARDIA)    Difficulty of Paying Living Expenses: Not hard at all  Food Insecurity: No Food Insecurity (01/24/2022)   Hunger Vital Sign  Worried About Programme researcher, broadcasting/film/video in the Last Year: Never true    Ran Out of Food in the Last Year: Never true  Transportation Needs: No Transportation Needs (01/24/2022)   PRAPARE - Administrator, Civil Service (Medical): No    Lack of Transportation (Non-Medical): No  Physical Activity: Insufficiently Active (01/24/2022)   Exercise Vital Sign    Days of Exercise per Week: 2 days    Minutes of Exercise per Session: 10 min  Stress: No Stress Concern Present (01/24/2022)   [employer] of Occupational Health - Occupational Stress Questionnaire    Feeling of Stress : Not at all  Social Connections: Moderately Integrated (01/24/2022)   Social Connection  and Isolation Panel [NHANES]    Frequency of Communication with Friends and Family: More than three times a week    Frequency of Social Gatherings with Friends and Family: More than three times a week    Attends Religious Services: More than 4 times per year    Active Member of [employer] or Organizations: No    Attends Engineer, structural: Never    Marital Status: Married    Tobacco Counseling Counseling given: Not Answered   Clinical Intake:  Pre-visit preparation completed: Yes  Pain : 0-10 Pain Score: 7  Pain Type: Chronic pain Pain Location: Arm Pain Orientation: Right Pain Descriptors / Indicators: Aching, Sharp, Constant, Sore Pain Onset: More than a month ago Pain Frequency: Constant Pain Relieving Factors: tylenol & alieve  Pain Relieving Factors: tylenol & alieve     How often do you need to have someone help you when you read instructions, pamphlets, or other written materials from your doctor or pharmacy?: 1 - Never What is the last grade level you completed in school?: some college  Diabetic?no   Interpreter Needed?: No      Activities of Daily Living    01/24/2022   12:34 PM  In your present state of health, do you have any difficulty performing the following activities:  Hearing? 0  Vision? 0  Difficulty concentrating or making decisions? 0  Walking or climbing stairs? 0  Dressing or bathing? 0  Doing errands, shopping? 0    Patient Care Team: Georganna Skeans, MD as PCP - General (Family Medicine)  Indicate any recent Medical Services you may have received from other than [facility] providers in the past year (date may be approximate).     Assessment:   This is a routine wellness examination for Laura.  Hearing/Vision screen No results found.  Dietary issues and exercise activities discussed:     Goals Addressed   None   Depression Screen    01/24/2022   12:27 PM 08/20/2021    1:25 PM  PHQ 2/9 Scores  PHQ - 2 Score 2 0  PHQ- 9  Score 11 1    Fall Risk    01/24/2022   12:32 PM  Fall Risk   Falls in the past year? 1  Number falls in past yr: 1  Injury with Fall? 0  Risk for fall due to : History of fall(s)  Follow up Falls evaluation completed    FALL RISK PREVENTION PERTAINING TO THE HOME:  Any stairs in or around the home? No  If so, are there any without handrails? No  Home free of loose throw rugs in walkways, pet beds, electrical cords, etc? Yes  Adequate lighting in your home to reduce risk of falls? Yes   ASSISTIVE DEVICES UTILIZED TO  PREVENT FALLS:  Life alert? No  Use of a cane, walker or w/c? No  Grab bars in the bathroom? Yes  Shower chair or bench in shower? Yes  Elevated toilet seat or a handicapped toilet? No    Cognitive Function:        01/24/2022   12:35 PM  6CIT Screen  What time? 0 points  Count back from 20 0 points  Months in reverse 0 points  Repeat phrase 2 points    Immunizations Immunization History  Administered Date(s) Administered   Moderna Sars-Covid-2 Vaccination 12/09/2019, 01/04/2020, 08/06/2020    TDAP status: Up to date  Flu Vaccine status: Up to date  Pneumococcal vaccine status: Due, Education has been provided regarding the importance of this vaccine. Advised may receive this vaccine at local pharmacy or Health Dept. Aware to provide a copy of the vaccination record if obtained from local pharmacy or Health Dept. Verbalized acceptance and understanding.  Covid-19 vaccine status: Completed vaccines  Qualifies for Shingles Vaccine? Yes   Zostavax completed No   Shingrix Completed?: No.    Education has been provided regarding the importance of this vaccine. Patient has been advised to call insurance company to determine out of pocket expense if they have not yet received this vaccine. Advised may also receive vaccine at local pharmacy or Health Dept. Verbalized acceptance and understanding.  Screening Tests Health Maintenance  Topic Date Due    HEMOGLOBIN A1C  Never done   FOOT EXAM  Never done   OPHTHALMOLOGY EXAM  Never done   HIV Screening  Never done   Hepatitis C Screening  Never done   Zoster Vaccines- Shingrix (1 of 2) Never done   PAP SMEAR-Modifier  Never done   COLONOSCOPY (Pts 45-62yrs Insurance coverage will need to be confirmed)  Never done   MAMMOGRAM  Never done   COVID-19 Vaccine (4 - Booster for Moderna series) 10/01/2020   INFLUENZA VACCINE  03/11/2022   TETANUS/TDAP  04/28/2030   HPV VACCINES  Aged Out    Health Maintenance  Health Maintenance Due  Topic Date Due   HEMOGLOBIN A1C  Never done   FOOT EXAM  Never done   OPHTHALMOLOGY EXAM  Never done   HIV Screening  Never done   Hepatitis C Screening  Never done   Zoster Vaccines- Shingrix (1 of 2) Never done   PAP SMEAR-Modifier  Never done   COLONOSCOPY (Pts 45-41yrs Insurance coverage will need to be confirmed)  Never done   MAMMOGRAM  Never done   COVID-19 Vaccine (4 - Booster for Moderna series) 10/01/2020    Colorectal cancer screening: Referral to GI placed provider notified. Pt aware the office will call re: appt.  Mammogram status: Ordered provider notified . Pt provided with contact info and advised to call to schedule appt.   Bone Density status: Ordered provider notified . Pt provided with contact info and advised to call to schedule appt.  Lung Cancer Screening: (Low Dose CT Chest recommended if Age 31-80 years, 30 pack-year currently smoking OR have quit w/in 15years.) does qualify.   Lung Cancer Screening Referral: provider notified   Additional Screening:  Hepatitis C Screening: does qualify; Completed provider notified   Vision Screening: Recommended annual ophthalmology exams for early detection of glaucoma and other disorders of the eye. Is the patient up to date with their annual eye exam?  No  Who is the provider or what is the name of the office in which the patient attends  annual eye exams? Patient does not remember at  the moment.  If pt is not established with a provider, would they like to be referred to a provider to establish care? No .   Dental Screening: Recommended annual dental exams for proper oral hygiene  Community Resource Referral / Chronic Care Management: CRR required this visit?  No   CCM required this visit?  No      Plan:     I have personally reviewed and noted the following in the patient's chart:   Medical and social history Use of alcohol, tobacco or illicit drugs  Current medications and supplements including opioid prescriptions. Patient is currently taking opioid prescriptions. Information provided to patient regarding non-opioid alternatives. Patient advised to discuss non-opioid treatment plan with their provider. Functional ability and status Nutritional status Physical activity Advanced directives List of other physicians Hospitalizations, surgeries, and ER visits in previous 12 months Vitals Screenings to include cognitive, depression, and falls Referrals and appointments  In addition, I have reviewed and discussed with patient certain preventive protocols, quality metrics, and best practice recommendations. A written personalized care plan for preventive services as well as general preventive health recommendations were provided to patient.     [place] Breeze, [place]   01/24/2022   Nurse Notes: provider notified

## 2022-01-27 ENCOUNTER — Ambulatory Visit (HOSPITAL_COMMUNITY)
Admission: RE | Admit: 2022-01-27 | Discharge: 2022-01-27 | Disposition: A | Discharge status: Home / Self Care | Payer: Medicare HMO | Source: Ambulatory Visit | Attending: Anesthesiology | Admitting: Anesthesiology

## 2022-01-27 ENCOUNTER — Ambulatory Visit (HOSPITAL_COMMUNITY)
Admission: RE | Admit: 2022-01-27 | Discharge: 2022-01-27 | Disposition: A | Discharge status: Home / Self Care | Payer: Medicare HMO | Source: Ambulatory Visit | Attending: Family Medicine | Admitting: Family Medicine

## 2022-01-27 DIAGNOSIS — R519 Headache, unspecified: Secondary | ICD-10-CM | POA: Insufficient documentation

## 2022-01-27 DIAGNOSIS — G629 Polyneuropathy, unspecified: Secondary | ICD-10-CM | POA: Diagnosis present

## 2022-01-27 DIAGNOSIS — R296 Repeated falls: Secondary | ICD-10-CM | POA: Diagnosis present

## 2022-01-27 DIAGNOSIS — R2689 Other abnormalities of gait and mobility: Secondary | ICD-10-CM | POA: Diagnosis present

## 2022-01-27 MED ORDER — GADOBUTROL 1 MMOL/ML IV SOLN
10.0000 mL | Freq: Once | INTRAVENOUS | Status: AC | PRN
  Administered 2022-01-27: 10 mL via INTRAVENOUS

## 2022-04-07 ENCOUNTER — Ambulatory Visit (INDEPENDENT_AMBULATORY_CARE_PROVIDER_SITE_OTHER): Payer: Medicare HMO | Admitting: Family Medicine

## 2022-04-07 ENCOUNTER — Encounter: Payer: Self-pay | Admitting: Family Medicine

## 2022-04-07 VITALS — BP 138/75 | HR 73 | Temp 97.7°F | Resp 16 | Wt 252.8 lb

## 2022-04-07 DIAGNOSIS — K219 Gastro-esophageal reflux disease without esophagitis: Secondary | ICD-10-CM

## 2022-04-07 DIAGNOSIS — I10 Essential (primary) hypertension: Secondary | ICD-10-CM

## 2022-04-07 DIAGNOSIS — G4700 Insomnia, unspecified: Secondary | ICD-10-CM

## 2022-04-09 MED ORDER — PROMETHAZINE HCL 25 MG PO TABS: 25.0000 mg | ORAL_TABLET | Freq: Four times a day (QID) | ORAL | 2 refills | Status: DC | PRN

## 2022-04-09 MED ORDER — ALBUTEROL SULFATE HFA 108 (90 BASE) MCG/ACT IN AERS: 2.0000 | INHALATION_SPRAY | Freq: Four times a day (QID) | RESPIRATORY_TRACT | 3 refills | Status: AC | PRN

## 2022-04-09 MED ORDER — AMLODIPINE BESYLATE 10 MG PO TABS: 10.0000 mg | ORAL_TABLET | Freq: Every day | ORAL | 1 refills | Status: DC

## 2022-04-09 MED ORDER — METFORMIN HCL 500 MG PO TABS
500.0000 mg | ORAL_TABLET | Freq: Two times a day (BID) | ORAL | 1 refills | Status: DC
Start: 2022-04-09 — End: 2022-10-02

## 2022-04-09 MED ORDER — METOPROLOL SUCCINATE ER 100 MG PO TB24: 100.0000 mg | ORAL_TABLET | Freq: Every day | ORAL | 1 refills | Status: DC

## 2022-04-09 MED ORDER — HYDROCHLOROTHIAZIDE 25 MG PO TABS: 25.0000 mg | ORAL_TABLET | Freq: Every day | ORAL | Status: DC

## 2022-04-09 MED ORDER — NORTRIPTYLINE HCL 25 MG PO CAPS: 25.0000 mg | ORAL_CAPSULE | Freq: Every day | ORAL | 1 refills | Status: DC

## 2022-04-09 MED ORDER — OMEPRAZOLE 20 MG PO CPDR: 20.0000 mg | DELAYED_RELEASE_CAPSULE | Freq: Every day | ORAL | 1 refills | Status: DC

## 2022-04-09 MED ORDER — LOSARTAN POTASSIUM 100 MG PO TABS: 100.0000 mg | ORAL_TABLET | Freq: Every day | ORAL | 1 refills | Status: DC

## 2022-04-09 MED ORDER — PAROXETINE HCL 30 MG PO TABS: 60.0000 mg | ORAL_TABLET | Freq: Every day | ORAL | 1 refills | Status: DC

## 2022-04-09 NOTE — Progress Notes (Signed)
Established Patient Office Visit  Subjective    Patient ID: Laura Reid, female    DOB: 05-Dec-1960  Age: 61 y.o. MRN: [id number]  CC:  Chief Complaint  Patient presents with   Follow-up    HPI [place] Opara presents for routine follow up of chronic med issues. Patient denies acute complaints or concerns.    Outpatient Encounter Medications as of 04/07/2022  Medication Sig   ALPRAZolam (XANAX) 0.5 MG tablet Take by mouth.   diclofenac Sodium (VOLTAREN) 1 % GEL Apply 2 g topically 4 (four) times daily.   diclofenac Sodium (VOLTAREN) 1 % GEL Apply topically.   Multiple Vitamins-Minerals (THERA-M) TABS Take 1 tablet by mouth daily.   oxyCODONE-acetaminophen (PERCOCET) 10-325 MG tablet Take 1 tablet by mouth 4 (four) times daily as needed.   tiZANidine (ZANAFLEX) 4 MG tablet Take 1/2 to 1 tablet up to three times daily as needed for muscle spasms   [DISCONTINUED] albuterol (VENTOLIN HFA) 108 (90 Base) MCG/ACT inhaler Inhale 2 puffs into the lungs every 6 (six) hours as needed for wheezing or shortness of breath.   [DISCONTINUED] amLODipine (NORVASC) 10 MG tablet Take 1 tablet (10 mg total) by mouth daily.   [DISCONTINUED] gabapentin (NEURONTIN) 300 MG capsule Take by mouth.   [DISCONTINUED] hydrochlorothiazide (HYDRODIURIL) 25 MG tablet Take 1 tablet (25 mg total) by mouth daily.   [DISCONTINUED] losartan (COZAAR) 100 MG tablet Take 1 tablet (100 mg total) by mouth daily.   [DISCONTINUED] metoprolol succinate (TOPROL-XL) 100 MG 24 hr tablet Take 1 tablet (100 mg total) by mouth daily. Take with or immediately following a meal.   [DISCONTINUED] nortriptyline (PAMELOR) 25 MG capsule Take 1 capsule (25 mg total) by mouth at bedtime.   [DISCONTINUED] omeprazole (PRILOSEC) 20 MG capsule Take 1 capsule (20 mg total) by mouth daily.   [DISCONTINUED] PARoxetine (PAXIL) 30 MG tablet Take 2 tablets (60 mg total) by mouth daily.   [DISCONTINUED] promethazine (PHENERGAN) 25 MG tablet Take by  mouth.   albuterol (VENTOLIN HFA) 108 (90 Base) MCG/ACT inhaler Inhale 2 puffs into the lungs every 6 (six) hours as needed for wheezing or shortness of breath.   amLODipine (NORVASC) 10 MG tablet Take 1 tablet (10 mg total) by mouth daily.   hydrochlorothiazide (HYDRODIURIL) 25 MG tablet Take 1 tablet (25 mg total) by mouth daily.   losartan (COZAAR) 100 MG tablet Take 1 tablet (100 mg total) by mouth daily.   metFORMIN (GLUCOPHAGE) 500 MG tablet Take 1 tablet (500 mg total) by mouth 2 (two) times daily.   metoprolol succinate (TOPROL-XL) 100 MG 24 hr tablet Take 1 tablet (100 mg total) by mouth daily. Take with or immediately following a meal.   nortriptyline (PAMELOR) 25 MG capsule Take 1 capsule (25 mg total) by mouth at bedtime.   omeprazole (PRILOSEC) 20 MG capsule Take 1 capsule (20 mg total) by mouth daily.   PARoxetine (PAXIL) 30 MG tablet Take 2 tablets (60 mg total) by mouth daily.   promethazine (PHENERGAN) 25 MG tablet Take 1 tablet (25 mg total) by mouth every 6 (six) hours as needed for nausea or vomiting.   [DISCONTINUED] gabapentin (NEURONTIN) 300 MG capsule Take 1 capsule (300 mg total) by mouth 3 (three) times daily.   [DISCONTINUED] metFORMIN (GLUCOPHAGE) 500 MG tablet Take 500 mg by mouth 2 (two) times daily.   No facility-administered encounter medications on file as of 04/07/2022.    History reviewed. No pertinent past medical history.  History reviewed. No pertinent surgical  history.  History reviewed. No pertinent family history.  Social History   Socioeconomic History   Marital status: Married    Spouse name: Not on file   Number of children: Not on file   Years of education: Not on file   Highest education level: Not on file  Occupational History   Not on file  Tobacco Use   Smoking status: Never   Smokeless tobacco: Never  Substance and Sexual Activity   Alcohol use: Not Currently   Drug use: Not Currently   Sexual activity: Yes  Other Topics Concern    Not on file  Social History Narrative   Not on file   Social Determinants of Health   Financial Resource Strain: Low Risk  (01/24/2022)   Overall Financial Resource Strain (CARDIA)    Difficulty of Paying Living Expenses: Not hard at all  Food Insecurity: No Food Insecurity (01/24/2022)   Hunger Vital Sign    Worried About Running Out of Food in the Last Year: Never true    Ran Out of Food in the Last Year: Never true  Transportation Needs: No Transportation Needs (01/24/2022)   PRAPARE - Administrator, Civil Service (Medical): No    Lack of Transportation (Non-Medical): No  Physical Activity: Insufficiently Active (01/24/2022)   Exercise Vital Sign    Days of Exercise per Week: 2 days    Minutes of Exercise per Session: 10 min  Stress: No Stress Concern Present (01/24/2022)   [employer] of Occupational Health - Occupational Stress Questionnaire    Feeling of Stress : Not at all  Social Connections: Moderately Integrated (01/24/2022)   Social Connection and Isolation Panel [NHANES]    Frequency of Communication with Friends and Family: More than three times a week    Frequency of Social Gatherings with Friends and Family: More than three times a week    Attends Religious Services: More than 4 times per year    Active Member of [employer] or Organizations: No    Attends Banker Meetings: Never    Marital Status: Married  Catering manager Violence: Not At Risk (01/24/2022)   Humiliation, Afraid, Rape, and Kick questionnaire    Fear of Current or Ex-Partner: No    Emotionally Abused: No    Physically Abused: No    Sexually Abused: No    Review of Systems  All other systems reviewed and are negative.       Objective    BP 138/75   Pulse 73   Temp 97.7 F (36.5 C) (Oral)   Resp 16   Wt 252 lb 12.8 oz (114.7 kg)   SpO2 95%   BMI 40.80 kg/m   Physical Exam Vitals and nursing note reviewed.  Constitutional:      General: She is not in acute  distress.    Appearance: She is obese.  Cardiovascular:     Rate and Rhythm: Normal rate and regular rhythm.  Pulmonary:     Effort: Pulmonary effort is normal.     Breath sounds: Normal breath sounds.  Abdominal:     Palpations: Abdomen is soft.     Tenderness: There is no abdominal tenderness.  Musculoskeletal:     Right lower leg: No edema.     Left lower leg: No edema.  Neurological:     General: No focal deficit present.     Mental Status: She is alert and oriented to person, place, and time.  Psychiatric:  Mood and Affect: Mood normal.        Behavior: Behavior normal.         Assessment & Plan:   1. Essential hypertension Appears stable. Meds refilled.  - losartan (COZAAR) 100 MG tablet; Take 1 tablet (100 mg total) by mouth daily.  Dispense: 90 tablet; Refill: 1 - amLODipine (NORVASC) 10 MG tablet; Take 1 tablet (10 mg total) by mouth daily.  Dispense: 90 tablet; Refill: 1  2. Gastroesophageal reflux disease without esophagitis Continue. Meds refilled.  - omeprazole (PRILOSEC) 20 MG capsule; Take 1 capsule (20 mg total) by mouth daily.  Dispense: 90 capsule; Refill: 1  3. Insomnia, unspecified type continue    Return in about 6 months (around 10/08/2022) for follow up.   Tommie Raymond, MD

## 2022-04-30 ENCOUNTER — Ambulatory Visit (INDEPENDENT_AMBULATORY_CARE_PROVIDER_SITE_OTHER): Payer: Medicare HMO | Admitting: Physician Assistant

## 2022-04-30 ENCOUNTER — Encounter: Payer: Self-pay | Admitting: Physician Assistant

## 2022-04-30 VITALS — BP 147/88 | HR 60 | Temp 97.2°F | Resp 16 | Wt 247.6 lb

## 2022-04-30 DIAGNOSIS — J069 Acute upper respiratory infection, unspecified: Secondary | ICD-10-CM

## 2022-04-30 MED ORDER — PREDNISONE 10 MG PO TABS: ORAL_TABLET | ORAL | 0 refills | Status: DC

## 2022-04-30 MED ORDER — AZITHROMYCIN 250 MG PO TABS: ORAL_TABLET | ORAL | 0 refills | Status: AC

## 2022-04-30 NOTE — Progress Notes (Signed)
Patient ID: Laura Reid, female   DOB: 1961-04-29, 61 y.o.   MRN: [id number]   Laura Reid, is a 61 y.o. female  EVO:[id number]  [id number]  DOB - Nov 18, 1960  Chief Complaint  Patient presents with   Bronchitis       Subjective:   Laura Reid is a 61 y.o. female here today for for 4 day h/o cough congestion, sneezing, wheezing.  Phlegm is green.  She has not wanted to do a Covid test bc she has a wedding to go to on Saturday.  No fever.  No increase in body aches.  No GI s/sx.  Used her inhaler this morning.  Last blood sugar was normal.    No problems updated.  ALLERGIES: Allergies  Allergen Reactions   Milnacipran Other (See Comments)    "Tremors" Severe HTN, labile    Duloxetine Other (See Comments)    "Tremors" Diaphoresis  ? serotonin syndrome    Clindamycin Rash   Pregabalin Anxiety and Other (See Comments)    Seratonin Syndrome ? Serotonin syndrome     PAST MEDICAL HISTORY: No past medical history on file.  MEDICATIONS AT HOME: Prior to Admission medications   Medication Sig Start Date End Date Taking? Authorizing Provider  azithromycin (ZITHROMAX) 250 MG tablet Take 2 tablets on day 1, then 1 tablet daily on days 2 through 5 04/30/22 05/05/22 Yes Marlin Jarrard M, PA-C  predniSONE (DELTASONE) 10 MG tablet 6,5,4,3,2,1 take each days dose all at once in the morning with food 04/30/22  Yes Pernie Grosso M, PA-C  albuterol (VENTOLIN HFA) 108 (90 Base) MCG/ACT inhaler Inhale 2 puffs into the lungs every 6 (six) hours as needed for wheezing or shortness of breath. 04/09/22   Georganna Skeans, MD  ALPRAZolam Prudy Feeler) 0.5 MG tablet Take by mouth.    [provider]  amLODipine (NORVASC) 10 MG tablet Take 1 tablet (10 mg total) by mouth daily. 04/09/22   Georganna Skeans, MD  diclofenac Sodium (VOLTAREN) 1 % GEL Apply 2 g topically 4 (four) times daily. 08/20/21   Georganna Skeans, MD  diclofenac Sodium (VOLTAREN) 1 % GEL Apply topically. 01/22/22 01/22/23   [provider]  hydrochlorothiazide (HYDRODIURIL) 25 MG tablet Take 1 tablet (25 mg total) by mouth daily. 04/09/22   Georganna Skeans, MD  losartan (COZAAR) 100 MG tablet Take 1 tablet (100 mg total) by mouth daily. 04/09/22   Georganna Skeans, MD  metFORMIN (GLUCOPHAGE) 500 MG tablet Take 1 tablet (500 mg total) by mouth 2 (two) times daily. 04/09/22   Georganna Skeans, MD  metoprolol succinate (TOPROL-XL) 100 MG 24 hr tablet Take 1 tablet (100 mg total) by mouth daily. Take with or immediately following a meal. 04/09/22   Georganna Skeans, MD  Multiple Vitamins-Minerals (THERA-M) TABS Take 1 tablet by mouth daily.    [provider]  nortriptyline (PAMELOR) 25 MG capsule Take 1 capsule (25 mg total) by mouth at bedtime. 04/09/22   Georganna Skeans, MD  omeprazole (PRILOSEC) 20 MG capsule Take 1 capsule (20 mg total) by mouth daily. 04/09/22   Georganna Skeans, MD  oxyCODONE-acetaminophen (PERCOCET) 10-325 MG tablet Take 1 tablet by mouth 4 (four) times daily as needed. 12/21/21   [provider]  PARoxetine (PAXIL) 30 MG tablet Take 2 tablets (60 mg total) by mouth daily. 04/09/22   Georganna Skeans, MD  promethazine (PHENERGAN) 25 MG tablet Take 1 tablet (25 mg total) by mouth every 6 (six) hours as needed for nausea or vomiting. 04/09/22  Dorna Mai, MD  tiZANidine (ZANAFLEX) 4 MG tablet Take 1/2 to 1 tablet up to three times daily as needed for muscle spasms 10/15/20   [provider]    ROS: Neg cardiac Neg GI Neg GU Neg MS Neg psych Neg neuro  Objective:   Vitals:   04/30/22 1505  BP: (!) 147/88  Pulse: 60  Resp: 16  Temp: (!) 97.2 F (36.2 C)  TempSrc: Oral  SpO2: 94%  Weight: 247 lb 9.6 oz (112.3 kg)   Exam General appearance : Awake, alert, not in any distress. Speech Clear. Not toxic looking HEENT: Atraumatic and Normocephalic Neck: Supple, no JVD. No cervical lymphadenopathy.  Chest: fair air entry bilaterally, CTAB.  No rales/rhonchi.  There is  minimal wheezing with forced expiration CVS: S1 S2 regular, no murmurs.  Extremities: B/L Lower Ext shows no edema, both legs are warm to touch Neurology: Awake alert, and oriented X 3, CN II-XII intact, Non focal Skin: No Rash  Data Review No results found for: "HGBA1C"  Assessment & Plan   1. URI, acute Will cover for atypicals.  Fluids, rest, respiratory care.  OOW note today and tomorrow.  She is off Friday.   - azithromycin (ZITHROMAX) 250 MG tablet; Take 2 tablets on day 1, then 1 tablet daily on days 2 through 5  Dispense: 6 tablet; Refill: 0 - predniSONE (DELTASONE) 10 MG tablet; 6,5,4,3,2,1 take each days dose all at once in the morning with food  Dispense: 21 tablet; Refill: 0 - COVID-19, Flu A+B and RSV    Return if symptoms worsen or fail to improve.  The patient was given clear instructions to go to ER or return to medical center if symptoms don't improve, worsen or new problems develop. The patient verbalized understanding. The patient was told to call to get lab results if they haven't heard anything in the next week.      Freeman Caldron, PA-C [facility] and [place] [place], [place]   04/30/2022, 3:20 PM

## 2022-04-30 NOTE — Progress Notes (Signed)
Patient c/o congestion x 3 days with ear popping and dizziness. Patient said that she has taken OTC medication with no relief.

## 2022-05-05 ENCOUNTER — Telehealth: Payer: Self-pay

## 2022-05-05 NOTE — Telephone Encounter (Signed)
Pt called to receive covid results- advised agent that results are not back. It does say it was collected and sent to [place].

## 2022-05-06 ENCOUNTER — Ambulatory Visit (INDEPENDENT_AMBULATORY_CARE_PROVIDER_SITE_OTHER): Payer: Medicare HMO | Admitting: Physician Assistant

## 2022-05-06 ENCOUNTER — Telehealth: Payer: Self-pay

## 2022-05-06 ENCOUNTER — Encounter: Payer: Self-pay | Admitting: Physician Assistant

## 2022-05-06 DIAGNOSIS — R051 Acute cough: Secondary | ICD-10-CM | POA: Diagnosis not present

## 2022-05-06 DIAGNOSIS — U071 COVID-19: Secondary | ICD-10-CM | POA: Diagnosis not present

## 2022-05-06 LAB — RESP PANEL BY RT-PCR (FLU A&B, COVID) ARPGX2
Influenza A by PCR: NEGATIVE
Influenza B by PCR: NEGATIVE
SARS Coronavirus 2 by RT PCR: POSITIVE — AB

## 2022-05-06 MED ORDER — BENZONATATE 200 MG PO CAPS: 200.0000 mg | ORAL_CAPSULE | Freq: Two times a day (BID) | ORAL | 0 refills | Status: DC | PRN

## 2022-05-06 NOTE — Telephone Encounter (Signed)
Copied from [place] [phone]. Topic: General - Other >> May 05, 2022 12:09 PM Chapman Fitch wrote: Reason for CRM: Pt called about her covid results / please advise   Pt Covid positive virtual appointment scheduled w/Cari Mayers

## 2022-05-06 NOTE — Progress Notes (Signed)
Established Patient Office Visit  Subjective   Patient ID: Laura Reid, female    DOB: 1961/01/19  Age: 61 y.o. MRN: [id number]  Chief Complaint  Patient presents with   Covid Positive   Virtual Visit via Telephone Note  I connected with [place] Zent on 05/06/22 at 10:00 AM EDT by telephone and verified that I am speaking with the correct person using two identifiers.  Location: Patient: Home  Provider: Home Office   I discussed the limitations, risks, security and privacy concerns of performing an evaluation and management service by telephone and the availability of in person appointments. I also discussed with the patient that there may be a patient responsible charge related to this service. The patient expressed understanding and agreed to proceed.   History of Present Illness: Patient states that she was seen on April 30, 2022 after having a 4-day history of productive cough, congestion, sneezing and wheezing.  She was treated with azithromycin and prednisone.  States today that she did finish the azithromycin, and has 1 day left of prednisone.  States that she continues to have tightness in her chest, cough is now dry.  States that she has also had improvement in congestion, is no longer sneezing or wheezing.  States that she has been using DayQuil and NyQuil without much relief for the cough.  States that she did learn that her COVID test was positive, would like to know how to proceed.    Observations/Objective: Medical history and current medications reviewed, no physical exam completed    History reviewed. No pertinent past medical history. Social History   Socioeconomic History   Marital status: Married    Spouse name: Not on file   Number of children: Not on file   Years of education: Not on file   Highest education level: Not on file  Occupational History   Not on file  Tobacco Use   Smoking status: Never   Smokeless tobacco: Never  Substance  and Sexual Activity   Alcohol use: Not Currently   Drug use: Not Currently   Sexual activity: Yes  Other Topics Concern   Not on file  Social History Narrative   Not on file   Social Determinants of Health   Financial Resource Strain: Low Risk  (01/24/2022)   Overall Financial Resource Strain (CARDIA)    Difficulty of Paying Living Expenses: Not hard at all  Food Insecurity: No Food Insecurity (01/24/2022)   Hunger Vital Sign    Worried About Running Out of Food in the Last Year: Never true    [place] in the Last Year: Never true  Transportation Needs: No Transportation Needs (01/24/2022)   PRAPARE - Hydrologist (Medical): No    Lack of Transportation (Non-Medical): No  Physical Activity: Insufficiently Active (01/24/2022)   Exercise Vital Sign    Days of Exercise per Week: 2 days    Minutes of Exercise per Session: 10 min  Stress: No Stress Concern Present (01/24/2022)   [place]    Feeling of Stress : Not at all  Social Connections: Moderately Integrated (01/24/2022)   Social Connection and Isolation Panel [NHANES]    Frequency of Communication with Friends and Family: More than three times a week    Frequency of Social Gatherings with Friends and Family: More than three times a week    Attends Religious Services: More than 4 times per year  Active Member of Clubs or Organizations: No    Attends Banker Meetings: Never    Marital Status: Married  Catering manager Violence: Not At Risk (01/24/2022)   Humiliation, Afraid, Rape, and Kick questionnaire    Fear of Current or Ex-Partner: No    Emotionally Abused: No    Physically Abused: No    Sexually Abused: No   History reviewed. No pertinent family history. Allergies  Allergen Reactions   Milnacipran Other (See Comments)    "Tremors" Severe HTN, labile    Duloxetine Other (See Comments)     "Tremors" Diaphoresis  ? serotonin syndrome    Clindamycin Rash   Pregabalin Anxiety and Other (See Comments)    Seratonin Syndrome ? Serotonin syndrome     Review of Systems  Constitutional:  Negative for chills and fever.  HENT:  Negative for congestion and sore throat.   Eyes: Negative.   Respiratory:  Positive for cough. Negative for shortness of breath and wheezing.   Cardiovascular:  Negative for chest pain.  Gastrointestinal:  Negative for nausea and vomiting.  Genitourinary: Negative.   Musculoskeletal: Negative.   Skin: Negative.   Neurological: Negative.   Endo/Heme/Allergies: Negative.   Psychiatric/Behavioral: Negative.        Assessment & Plan:   Problem List Items Addressed This Visit       Other   COVID-19 - Primary   Relevant Medications   benzonatate (TESSALON) 200 MG capsule   Other Visit Diagnoses     Acute cough       Relevant Medications   benzonatate (TESSALON) 200 MG capsule       Assessment and Plan: 1. COVID-19 Trial Tessalon Per[place].  Patient education given on avoiding NyQuil due to antihypertensive medications.  Patient education given on supportive care.  Red flags given for prompt reevaluation.  Patient's symptoms began 10 days ago, positive COVID test was 6 days ago.  Patient education given on current guidelines for masking, returning to work. - benzonatate (TESSALON) 200 MG capsule; Take 1 capsule (200 mg total) by mouth 2 (two) times daily as needed for cough.  Dispense: 20 capsule; Refill: 0  2. Acute cough  - benzonatate (TESSALON) 200 MG capsule; Take 1 capsule (200 mg total) by mouth 2 (two) times daily as needed for cough.  Dispense: 20 capsule; Refill: 0  Follow Up Instructions:    I discussed the assessment and treatment plan with the patient. The patient was provided an opportunity to ask questions and all were answered. The patient agreed with the plan and demonstrated an understanding of the instructions.   The  patient was advised to call back or seek an in-person evaluation if the symptoms worsen or if the condition fails to improve as anticipated.  I provided 12 minutes of non-face-to-face time during this encounter.   Return if symptoms worsen or fail to improve.    Kasandra Knudsen Mayers, PA-C

## 2022-05-06 NOTE — Patient Instructions (Signed)
[employer], thank you for joining [employer], PA-C for today's virtual visit.  While this provider is not your primary care provider (PCP), if your PCP is located in our provider database this encounter information will be shared with them immediately following your visit.  Consent: (Patient) Laura Reid provided verbal consent for this virtual visit at the beginning of the encounter.  Current Medications:  Current Outpatient Medications:    benzonatate (TESSALON) 200 MG capsule, Take 1 capsule (200 mg total) by mouth 2 (two) times daily as needed for cough., Disp: 20 capsule, Rfl: 0   albuterol (VENTOLIN HFA) 108 (90 Base) MCG/ACT inhaler, Inhale 2 puffs into the lungs every 6 (six) hours as needed for wheezing or shortness of breath., Disp: 18 g, Rfl: 3   ALPRAZolam (XANAX) 0.5 MG tablet, Take by mouth., Disp: , Rfl:    amLODipine (NORVASC) 10 MG tablet, Take 1 tablet (10 mg total) by mouth daily., Disp: 90 tablet, Rfl: 1   diclofenac Sodium (VOLTAREN) 1 % GEL, Apply 2 g topically 4 (four) times daily., Disp: 350 g, Rfl: 1   diclofenac Sodium (VOLTAREN) 1 % GEL, Apply topically., Disp: , Rfl:    hydrochlorothiazide (HYDRODIURIL) 25 MG tablet, Take 1 tablet (25 mg total) by mouth daily., Disp: 90 tablet, Rfl: 01   losartan (COZAAR) 100 MG tablet, Take 1 tablet (100 mg total) by mouth daily., Disp: 90 tablet, Rfl: 1   metFORMIN (GLUCOPHAGE) 500 MG tablet, Take 1 tablet (500 mg total) by mouth 2 (two) times daily., Disp: 180 tablet, Rfl: 1   metoprolol succinate (TOPROL-XL) 100 MG 24 hr tablet, Take 1 tablet (100 mg total) by mouth daily. Take with or immediately following a meal., Disp: 90 tablet, Rfl: 1   Multiple Vitamins-Minerals (THERA-M) TABS, Take 1 tablet by mouth daily., Disp: , Rfl:    nortriptyline (PAMELOR) 25 MG capsule, Take 1 capsule (25 mg total) by mouth at bedtime., Disp: 90 capsule, Rfl: 1   omeprazole (PRILOSEC) 20 MG capsule, Take 1 capsule (20 mg total) by mouth  daily., Disp: 90 capsule, Rfl: 1   oxyCODONE-acetaminophen (PERCOCET) 10-325 MG tablet, Take 1 tablet by mouth 4 (four) times daily as needed., Disp: , Rfl:    PARoxetine (PAXIL) 30 MG tablet, Take 2 tablets (60 mg total) by mouth daily., Disp: 180 tablet, Rfl: 1   predniSONE (DELTASONE) 10 MG tablet, 6,5,4,3,2,1 take each days dose all at once in the morning with food, Disp: 21 tablet, Rfl: 0   promethazine (PHENERGAN) 25 MG tablet, Take 1 tablet (25 mg total) by mouth every 6 (six) hours as needed for nausea or vomiting., Disp: 30 tablet, Rfl: 2   tiZANidine (ZANAFLEX) 4 MG tablet, Take 1/2 to 1 tablet up to three times daily as needed for muscle spasms, Disp: , Rfl:    Medications ordered in this encounter:  Meds ordered this encounter  Medications   benzonatate (TESSALON) 200 MG capsule    Sig: Take 1 capsule (200 mg total) by mouth 2 (two) times daily as needed for cough.    Dispense:  20 capsule    Refill:  0    Order Specific Question:   Supervising Provider    Answer:   Elsie Stain [[id number]]     *If you need refills on other medications prior to your next appointment, please contact your pharmacy*  Follow-Up: Call back or seek an in-person evaluation if the symptoms worsen or if the condition fails to improve as anticipated.  [place] [phone]  Other Instructions Cough, Adult Coughing is a reflex that clears your throat and your airways (respiratory system). Coughing helps to heal and protect your lungs. It is normal to cough occasionally, but a cough that happens with other symptoms or lasts a long time may be a sign of a condition that needs treatment. An acute cough may only last 2-3 weeks, while a chronic cough may last 8 or more weeks. Coughing is commonly caused by: Infection of the respiratory systemby viruses or bacteria. Breathing in substances that irritate your lungs. Allergies. Asthma. Mucus that runs down the back of your throat  (postnasal drip). Smoking. Acid backing up from the stomach into the esophagus (gastroesophageal reflux). Certain medicines. Chronic lung problems. Other medical conditions such as heart failure or a blood clot in the lung (pulmonary embolism). Follow these instructions at home: Medicines Take over-the-counter and prescription medicines only as told by your health care provider. Talk with your health care provider before you take a cough suppressant medicine. Lifestyle  Avoid cigarette smoke. Do not use any products that contain nicotine or tobacco, such as cigarettes, e-cigarettes, and chewing tobacco. If you need help quitting, ask your health care provider. Drink enough fluid to keep your urine pale yellow. Avoid caffeine. Do not drink alcohol if your health care provider tells you not to drink. General instructions  Pay close attention to changes in your cough. Tell your health care provider about them. Always cover your mouth when you cough. Avoid things that make you cough, such as perfume, candles, cleaning products, or campfire or tobacco smoke. If the air is dry, use a cool mist vaporizer or humidifier in your bedroom or your home to help loosen secretions. If your cough is worse at night, try to sleep in a semi-upright position. Rest as needed. Keep all follow-up visits as told by your health care provider. This is important. Contact a health care provider if you: Have new symptoms. Cough up pus. Have a cough that does not get better after 2-3 weeks or gets worse. Cannot control your cough with cough suppressant medicines and you are losing sleep. Have pain that gets worse or pain that is not helped with medicine. Have a fever. Have unexplained weight loss. Have night sweats. Get help right away if: You cough up blood. You have difficulty breathing. Your heartbeat is very fast. These symptoms may represent a serious problem that is an emergency. Do not wait to see if  the symptoms will go away. Get medical help right away. Call your local emergency services (911 in the [place] Do not drive yourself to the hospital. Summary Coughing is a reflex that clears your throat and your airways. It is normal to cough occasionally, but a cough that happens with other symptoms or lasts a long time may be a sign of a condition that needs treatment. Take over-the-counter and prescription medicines only as told by your health care provider. Always cover your mouth when you cough. Contact a health care provider if you have new symptoms or a cough that does not get better after 2-3 weeks or gets worse. This information is not intended to replace advice given to you by your health care provider. Make sure you discuss any questions you have with your health care provider. Document Revised: 08/16/2018 Document Reviewed: 08/16/2018 Elsevier Patient Education  [place].

## 2022-05-19 ENCOUNTER — Encounter: Payer: Self-pay | Admitting: Family Medicine

## 2022-05-19 ENCOUNTER — Ambulatory Visit (INDEPENDENT_AMBULATORY_CARE_PROVIDER_SITE_OTHER): Payer: Medicare HMO | Admitting: Family Medicine

## 2022-05-19 VITALS — BP 147/87 | HR 57 | Temp 98.4°F | Resp 16 | Wt 252.6 lb

## 2022-05-19 DIAGNOSIS — U099 Post covid-19 condition, unspecified: Secondary | ICD-10-CM | POA: Diagnosis not present

## 2022-05-19 DIAGNOSIS — R0609 Other forms of dyspnea: Secondary | ICD-10-CM

## 2022-05-19 NOTE — Progress Notes (Unsigned)
Patient is her with c/o still  not feeling better since tested COVID+ x wks ago. Patient still have dry cough, fatigue,and SOB.

## 2022-05-20 ENCOUNTER — Encounter: Payer: Self-pay | Admitting: Family Medicine

## 2022-05-20 NOTE — Progress Notes (Signed)
Established Patient Office Visit  Subjective    Patient ID: Laura Reid, female    DOB: 09-May-1961  Age: 61 y.o. MRN: [id number]  CC: No chief complaint on file.   HPI Laura Reid presents with complaint of having been diagnosed with Covid about 3-4 weeks ago. She reports that she has persistent SOB and intermittent dry  cough. It is affecting her daily activities. She denies fever/chills.    Outpatient Encounter Medications as of 05/19/2022  Medication Sig   gabapentin (NEURONTIN) 300 MG capsule Take by mouth.   albuterol (VENTOLIN HFA) 108 (90 Base) MCG/ACT inhaler Inhale 2 puffs into the lungs every 6 (six) hours as needed for wheezing or shortness of breath.   ALPRAZolam (XANAX) 0.5 MG tablet Take by mouth.   amLODipine (NORVASC) 10 MG tablet Take 1 tablet (10 mg total) by mouth daily.   benzonatate (TESSALON) 200 MG capsule Take 1 capsule (200 mg total) by mouth 2 (two) times daily as needed for cough.   diclofenac Sodium (VOLTAREN) 1 % GEL Apply 2 g topically 4 (four) times daily.   diclofenac Sodium (VOLTAREN) 1 % GEL Apply topically.   hydrochlorothiazide (HYDRODIURIL) 25 MG tablet Take 1 tablet (25 mg total) by mouth daily.   losartan (COZAAR) 100 MG tablet Take 1 tablet (100 mg total) by mouth daily.   metFORMIN (GLUCOPHAGE) 500 MG tablet Take 1 tablet (500 mg total) by mouth 2 (two) times daily.   metoprolol succinate (TOPROL-XL) 100 MG 24 hr tablet Take 1 tablet (100 mg total) by mouth daily. Take with or immediately following a meal.   Multiple Vitamins-Minerals (THERA-M) TABS Take 1 tablet by mouth daily.   nortriptyline (PAMELOR) 25 MG capsule Take 1 capsule (25 mg total) by mouth at bedtime.   omeprazole (PRILOSEC) 20 MG capsule Take 1 capsule (20 mg total) by mouth daily.   oxyCODONE-acetaminophen (PERCOCET) 10-325 MG tablet Take 1 tablet by mouth 4 (four) times daily as needed.   PARoxetine (PAXIL) 30 MG tablet Take 2 tablets (60 mg total) by mouth daily.    predniSONE (DELTASONE) 10 MG tablet 6,5,4,3,2,1 take each days dose all at once in the morning with food   promethazine (PHENERGAN) 25 MG tablet Take 1 tablet (25 mg total) by mouth every 6 (six) hours as needed for nausea or vomiting.   tiZANidine (ZANAFLEX) 4 MG tablet Take 1/2 to 1 tablet up to three times daily as needed for muscle spasms   No facility-administered encounter medications on file as of 05/19/2022.    No past medical history on file.  No past surgical history on file.  No family history on file.  Social History   Socioeconomic History   Marital status: Married    Spouse name: Not on file   Number of children: Not on file   Years of education: Not on file   Highest education level: Not on file  Occupational History   Not on file  Tobacco Use   Smoking status: Never   Smokeless tobacco: Never  Substance and Sexual Activity   Alcohol use: Not Currently   Drug use: Not Currently   Sexual activity: Yes  Other Topics Concern   Not on file  Social History Narrative   Not on file   Social Determinants of Health   Financial Resource Strain: Low Risk  (01/24/2022)   Overall Financial Resource Strain (CARDIA)    Difficulty of Paying Living Expenses: Not hard at all  Food Insecurity: No Food Insecurity (01/24/2022)  Hunger Vital Sign    Worried About Running Out of Food in the Last Year: Never true    Ran Out of Food in the Last Year: Never true  Transportation Needs: No Transportation Needs (01/24/2022)   PRAPARE - Hydrologist (Medical): No    Lack of Transportation (Non-Medical): No  Physical Activity: Insufficiently Active (01/24/2022)   Exercise Vital Sign    Days of Exercise per Week: 2 days    Minutes of Exercise per Session: 10 min  Stress: No Stress Concern Present (01/24/2022)   [place]    Feeling of Stress : Not at all  Social Connections: Moderately  Integrated (01/24/2022)   Social Connection and Isolation Panel [NHANES]    Frequency of Communication with Friends and Family: More than three times a week    Frequency of Social Gatherings with Friends and Family: More than three times a week    Attends Religious Services: More than 4 times per year    Active Member of [employer] or Organizations: No    Attends Archivist Meetings: Never    Marital Status: Married  Human resources officer Violence: Not At Risk (01/24/2022)   Humiliation, Afraid, Rape, and Kick questionnaire    Fear of Current or Ex-Partner: No    Emotionally Abused: No    Physically Abused: No    Sexually Abused: No    Review of Systems  All other systems reviewed and are negative.       Objective    BP (!) 147/87   Pulse (!) 57   Temp 98.4 F (36.9 C) (Oral)   Resp 16   Wt 252 lb 9.6 oz (114.6 kg)   SpO2 93%   BMI 40.77 kg/m   Physical Exam Vitals and nursing note reviewed.  Constitutional:      General: She is not in acute distress. Cardiovascular:     Rate and Rhythm: Normal rate and regular rhythm.  Pulmonary:     Effort: Pulmonary effort is normal.     Breath sounds: Normal breath sounds. No wheezing.  Abdominal:     Palpations: Abdomen is soft.     Tenderness: There is no abdominal tenderness.  Neurological:     General: No focal deficit present.     Mental Status: She is alert and oriented to person, place, and time.         Assessment & Plan:   1. COVID-19 long hauler manifesting chronic dyspnea Patient deferred CXR. Will refer to pulmonary for further eval/mgt - Ambulatory referral to Pulmonology    No follow-ups on file.   Becky Sax, MD

## 2022-06-03 ENCOUNTER — Encounter: Payer: Self-pay | Admitting: Family Medicine

## 2022-08-22 ENCOUNTER — Other Ambulatory Visit: Payer: Self-pay | Admitting: Family Medicine

## 2022-09-04 DIAGNOSIS — M75121 Complete rotator cuff tear or rupture of right shoulder, not specified as traumatic: Secondary | ICD-10-CM | POA: Insufficient documentation

## 2022-09-08 ENCOUNTER — Other Ambulatory Visit: Payer: Self-pay | Admitting: Family Medicine

## 2022-09-08 DIAGNOSIS — I10 Essential (primary) hypertension: Secondary | ICD-10-CM

## 2022-09-09 NOTE — Telephone Encounter (Signed)
Requested Prescriptions  Pending Prescriptions Disp Refills   losartan (COZAAR) 100 MG tablet [Pharmacy Med Name: LOSARTAN POTASSIUM 100 MG TAB] 90 tablet     Sig: TAKE 1 TABLET BY MOUTH EVERY DAY     Cardiovascular:  Angiotensin Receptor Blockers Failed - 09/08/2022  6:58 PM      Failed - Cr in normal range and within 180 days    No results found for: "CREATININE", "LABCREAU", "LABCREA", "POCCRE"       Failed - K in normal range and within 180 days    No results found for: "K", "POTASSIUM", "POCK"       Failed - Last BP in normal range    BP Readings from Last 1 Encounters:  05/19/22 (!) 147/87         Passed - Patient is not pregnant      Passed - Valid encounter within last 6 months    Recent Outpatient Visits           3 months ago COVID-19 long hauler manifesting chronic dyspnea   [place] [facility], MD   4 months ago COVID-19   [facility] at [facility], Cari S, PA-C   4 months ago URI, acute   [place] [facility] at [facility], Dionne Bucy, [place]   5 months ago Essential hypertension   [place] [facility] at [facility], MD   8 months ago Falls frequently   [facility] at [facility], Clyde Canterbury, MD       Future Appointments             In 2 months Dorna Mai, MD [facility] Primary [facility] at [facility]             amLODipine (NORVASC) 10 MG tablet [Pharmacy Med Name: AMLODIPINE BESYLATE 10 MG TAB] 90 tablet 0    Sig: TAKE 1 TABLET BY MOUTH EVERY DAY     Cardiovascular: Calcium Channel Blockers 2 Failed - 09/08/2022  6:58 PM      Failed - Last BP in normal range    BP Readings from Last 1 Encounters:  05/19/22 (!) 147/87         Passed - Last Heart Rate in normal range    Pulse Readings from Last 1 Encounters:  05/19/22 (!) 57         Passed - Valid encounter within last 6 months    Recent Outpatient Visits           3  months ago COVID-19 long hauler manifesting chronic dyspnea   [place] [facility] at [facility], MD   4 months ago COVID-19   [facility] at [facility], Loraine Grip, PA-C   4 months ago URI, acute   [facility] at [facility], Dionne Bucy, [place]   5 months ago Essential hypertension   [place] [facility] at [facility], MD   8 months ago Falls frequently   [facility] at [facility], MD       Future Appointments             In 2 months Dorna Mai, MD [facility] at [facility]

## 2022-09-09 NOTE — Telephone Encounter (Signed)
Requested medication (s) are due for refill today: no  Requested medication (s) are on the active medication list: yes  Last refill:  04/09/22 #90 1 RF  Future visit scheduled: yes  Notes to clinic:  No results for K or Cr   Requested Prescriptions  Pending Prescriptions Disp Refills   losartan (COZAAR) 100 MG tablet [Pharmacy Med Name: LOSARTAN POTASSIUM 100 MG TAB] 90 tablet     Sig: TAKE 1 TABLET BY MOUTH EVERY DAY     Cardiovascular:  Angiotensin Receptor Blockers Failed - 09/08/2022  6:58 PM      Failed - Cr in normal range and within 180 days    No results found for: "CREATININE", "LABCREAU", "LABCREA", "POCCRE"       Failed - K in normal range and within 180 days    No results found for: "K", "POTASSIUM", "POCK"       Failed - Last BP in normal range    BP Readings from Last 1 Encounters:  05/19/22 (!) 147/87         Passed - Patient is not pregnant      Passed - Valid encounter within last 6 months    Recent Outpatient Visits           3 months ago COVID-19 long hauler manifesting chronic dyspnea   [place] [facility], MD   4 months ago COVID-19   [facility], Loraine Grip, PA-C   4 months ago URI, acute   [place] [facility] at [facility], Dionne Bucy, [place]   5 months ago Essential hypertension   [place] [facility], MD   8 months ago Falls frequently   [facility] at [facility], Clyde Canterbury, MD       Future Appointments             In 2 months Dorna Mai, MD [facility] Primary [facility] at [place] Refills   amLODipine (NORVASC) 10 MG tablet 90 tablet 0    Sig: TAKE 1 TABLET BY MOUTH EVERY DAY     Cardiovascular: Calcium Channel Blockers 2 Failed - 09/08/2022  6:58 PM      Failed - Last BP in normal range    BP Readings from Last 1 Encounters:  05/19/22 (!) 147/87          Passed - Last Heart Rate in normal range    Pulse Readings from Last 1 Encounters:  05/19/22 (!) 57         Passed - Valid encounter within last 6 months    Recent Outpatient Visits           3 months ago COVID-19 long hauler manifesting chronic dyspnea   [place] [facility] at [facility], MD   4 months ago COVID-19   [facility] at [facility], Loraine Grip, PA-C   4 months ago URI, acute   [facility] at [facility], Dionne Bucy, [place]   5 months ago Essential hypertension   [place] [facility] at [facility], MD   8 months ago Falls frequently   [facility] at [facility], MD       Future Appointments  In 2 months Dorna Mai, MD [facility] at [facility]

## 2022-10-02 ENCOUNTER — Other Ambulatory Visit: Payer: Self-pay | Admitting: Family Medicine

## 2022-10-02 NOTE — Telephone Encounter (Signed)
Requested medication (s) are due for refill today: due 2 30/24  Requested medication (s) are on the active medication list: yes    Last refill: 04/09/22  Both meds  3 month supply and 1 refill  Future visit scheduled yes 11/18/22  Notes to clinic:Failed due to labs, please review. Thank you.  Requested Prescriptions  Pending Prescriptions Disp Refills   hydrochlorothiazide (HYDRODIURIL) 25 MG tablet [Pharmacy Med Name: HYDROCHLOROTHIAZIDE 25 MG TAB] 90 tablet 1    Sig: TAKE 1 TABLET (25 MG TOTAL) BY MOUTH DAILY.     Cardiovascular: Diuretics - Thiazide Failed - 10/02/2022  3:53 PM      Failed - Cr in normal range and within 180 days    No results found for: "CREATININE", "LABCREAU", "LABCREA", "POCCRE"       Failed - K in normal range and within 180 days    No results found for: "K", "POTASSIUM", "POCK"       Failed - Na in normal range and within 180 days    No results found for: "NA", "POCNA"       Failed - Last BP in normal range    BP Readings from Last 1 Encounters:  05/19/22 (!) 147/87         Passed - Valid encounter within last 6 months    Recent Outpatient Visits           4 months ago COVID-19 long hauler manifesting chronic dyspnea   [place] [facility] at [facility], Clyde Canterbury, [place]   4 months ago COVID-19   [facility] at [facility], Cari S, PA-C   5 months ago URI, acute   [place] [facility] at [facility], [place], [place]   5 months ago Essential hypertension   [place] [facility] at [facility], Clyde Canterbury, MD   9 months ago Falls frequently    Primary Care at [facility], Clyde Canterbury, MD       Future Appointments             In 1 month Dorna Mai, MD [facility] Primary [facility] at [facility]             metFORMIN (GLUCOPHAGE) 500 MG tablet [Pharmacy Med Name: METFORMIN HCL 500 MG TABLET] 180 tablet 1    Sig: TAKE 1 [place] A DAY     Endocrinology:   Diabetes - Biguanides Failed - 10/02/2022  3:53 PM      Failed - Cr in normal range and within 360 days    No results found for: "CREATININE", "LABCREAU", "LABCREA", "POCCRE"       Failed - HBA1C is between 0 and 7.9 and within 180 days    No results found for: "HGBA1C", "LABA1C"       Failed - eGFR in normal range and within 360 days    No results found for: "GFRAA", "GFRNONAA", "GFR", "EGFR"       Failed - B12 Level in normal range and within 720 days    No results found for: "VITAMINB12"       Failed - CBC within normal limits and completed in the last 12 months    No results found for: "WBC", "WBCKUC" No results found for: "RBC", "RBCKUC" No results found for: "HGB", "HGBKUC", "HGBPOCKUC", "HGBOTHER", "TOTHGB", "HGBPLASMA", "LABHEMOF" No results found for: "HCT", "HCTKUC", "SRHCT" No results found for: "MCHC", "[place]" No results found for: "MCH", "[place]" No results  found for: "MCVKUC", "MCV" No results found for: "PLTCOUNTKUC", "LABPLAT", "POCPLA" No results found for: "RDW", "RDWKUC", "POCRDW"       Passed - Valid encounter within last 6 months    Recent Outpatient Visits           4 months ago COVID-19 long hauler manifesting chronic dyspnea   [place] [facility], MD   4 months ago COVID-19   [facility] at [facility], Loraine Grip, PA-C   5 months ago URI, acute   [place] [facility] at [facility], Dionne Bucy, [place]   5 months ago Essential hypertension   [place] [facility], MD   9 months ago Falls frequently   [place] at [facility], MD       Future Appointments             In 1 month Dorna Mai, MD [facility] Primary Care at [place] Refills   metoprolol succinate (TOPROL-XL) 100 MG 24 hr tablet 90 tablet 0    Sig: TAKE 1 TABLET BY MOUTH DAILY. TAKE WITH OR  IMMEDIATELY FOLLOWING A MEAL.     Cardiovascular:  Beta Blockers Failed - 10/02/2022  3:53 PM      Failed - Last BP in normal range    BP Readings from Last 1 Encounters:  05/19/22 (!) 147/87         Passed - Last Heart Rate in normal range    Pulse Readings from Last 1 Encounters:  05/19/22 (!) 57         Passed - Valid encounter within last 6 months    Recent Outpatient Visits           4 months ago COVID-19 long hauler manifesting chronic dyspnea   [place] [facility], MD   4 months ago COVID-19   [facility] at [facility], Loraine Grip, PA-C   5 months ago URI, acute   [facility] at [facility], Dionne Bucy, [place]   5 months ago Essential hypertension    Primary Care at [facility], MD   9 months ago Falls frequently   [facility] at [facility] [place], [place]       Future Appointments             In 1 month Dorna Mai, MD [place] at [facility]

## 2022-10-02 NOTE — Telephone Encounter (Signed)
Requested Prescriptions  Pending Prescriptions Disp Refills   hydrochlorothiazide (HYDRODIURIL) 25 MG tablet [Pharmacy Med Name: HYDROCHLOROTHIAZIDE 25 MG TAB] 90 tablet 1    Sig: TAKE 1 TABLET (25 MG TOTAL) BY MOUTH DAILY.     Cardiovascular: Diuretics - Thiazide Failed - 10/02/2022  3:53 PM      Failed - Cr in normal range and within 180 days    No results found for: "CREATININE", "LABCREAU", "LABCREA", "POCCRE"       Failed - K in normal range and within 180 days    No results found for: "K", "POTASSIUM", "POCK"       Failed - Na in normal range and within 180 days    No results found for: "NA", "POCNA"       Failed - Last BP in normal range    BP Readings from Last 1 Encounters:  05/19/22 (!) 147/87         Passed - Valid encounter within last 6 months    Recent Outpatient Visits           4 months ago COVID-19 long hauler manifesting chronic dyspnea   [place] [facility], Clyde Canterbury, MD   4 months ago COVID-19   [facility], Cari S, PA-C   5 months ago URI, acute   [place] [facility], [place], [place]   5 months ago Essential hypertension   [place] [facility] at [facility], Clyde Canterbury, MD   9 months ago Falls frequently   [place] [facility] at [facility], Clyde Canterbury, MD       Future Appointments             In 1 month Dorna Mai, MD [facility] at [facility]             metFORMIN (GLUCOPHAGE) 500 MG tablet [Pharmacy Med Name: METFORMIN HCL 500 MG TABLET] 180 tablet 1    Sig: TAKE 1 [place] A DAY     Endocrinology:  Diabetes - Biguanides Failed - 10/02/2022  3:53 PM      Failed - Cr in normal range and within 360 days    No results found for: "CREATININE", "LABCREAU", "LABCREA", "POCCRE"       Failed - HBA1C is between 0 and 7.9 and within 180 days    No results found for: "HGBA1C", "LABA1C"       Failed  - eGFR in normal range and within 360 days    No results found for: "GFRAA", "GFRNONAA", "GFR", "EGFR"       Failed - B12 Level in normal range and within 720 days    No results found for: "VITAMINB12"       Failed - CBC within normal limits and completed in the last 12 months    No results found for: "WBC", "WBCKUC" No results found for: "RBC", "RBCKUC" No results found for: "HGB", "HGBKUC", "HGBPOCKUC", "HGBOTHER", "TOTHGB", "HGBPLASMA", "LABHEMOF" No results found for: "HCT", "HCTKUC", "SRHCT" No results found for: "MCHC", "MCHCKUC" No results found for: "MCH", "[place]" No results found for: "MCVKUC", "MCV" No results found for: "PLTCOUNTKUC", "LABPLAT", "POCPLA" No results found for: "RDW", "RDWKUC", "POCRDW"       Passed - Valid encounter within last 6 months    Recent Outpatient Visits           4 months ago COVID-19 long hauler manifesting chronic  dyspnea   [place] [facility] at [facility], MD   4 months ago COVID-19   [facility], Loraine Grip, [place]   5 months ago URI, acute   [facility], Dionne Bucy, [place]   5 months ago Essential hypertension   [place] [facility] at [facility], MD   9 months ago Falls frequently   [facility] at [facility], MD       Future Appointments             In 1 month Dorna Mai, MD [facility]             metoprolol succinate (TOPROL-XL) 100 MG 24 hr tablet [Pharmacy Med Name: METOPROLOL SUCC ER 100 MG TAB] 90 tablet 1    Sig: TAKE 1 TABLET BY MOUTH DAILY. TAKE WITH OR IMMEDIATELY FOLLOWING A MEAL.     Cardiovascular:  Beta Blockers Failed - 10/02/2022  3:53 PM      Failed - Last BP in normal range    BP Readings from Last 1 Encounters:  05/19/22 (!) 147/87         Passed - Last Heart Rate in normal range    Pulse Readings from Last 1 Encounters:   05/19/22 (!) 57         Passed - Valid encounter within last 6 months    Recent Outpatient Visits           4 months ago COVID-19 long hauler manifesting chronic dyspnea   [place] [facility], MD   4 months ago COVID-19   [facility] at [facility], Loraine Grip, PA-C   5 months ago URI, acute   [facility] at [facility], Dionne Bucy, [place]   5 months ago Essential hypertension   [place] [facility] at [facility], MD   9 months ago Falls frequently   [facility] at [facility], MD       Future Appointments             In 1 month Dorna Mai, MD [place] at [facility]

## 2022-10-07 ENCOUNTER — Encounter: Payer: Self-pay | Admitting: Family Medicine

## 2022-11-18 ENCOUNTER — Ambulatory Visit (INDEPENDENT_AMBULATORY_CARE_PROVIDER_SITE_OTHER): Payer: Medicare HMO | Admitting: Family Medicine

## 2022-11-18 ENCOUNTER — Encounter: Payer: Self-pay | Admitting: Family Medicine

## 2022-11-18 VITALS — BP 138/81 | HR 58 | Temp 98.1°F | Resp 16 | Wt 246.6 lb

## 2022-11-18 DIAGNOSIS — R5383 Other fatigue: Secondary | ICD-10-CM

## 2022-11-18 DIAGNOSIS — I10 Essential (primary) hypertension: Secondary | ICD-10-CM | POA: Diagnosis not present

## 2022-11-18 DIAGNOSIS — G4700 Insomnia, unspecified: Secondary | ICD-10-CM

## 2022-11-19 LAB — CBC WITH DIFFERENTIAL/PLATELET
Basophils Absolute: 0.1 10*3/uL (ref 0.0–0.2)
Basos: 1 %
EOS (ABSOLUTE): 0.5 10*3/uL — ABNORMAL HIGH (ref 0.0–0.4)
Eos: 8 %
Hematocrit: 36.4 % (ref 34.0–46.6)
Hemoglobin: 11.9 g/dL (ref 11.1–15.9)
Immature Grans (Abs): 0 10*3/uL (ref 0.0–0.1)
Immature Granulocytes: 0 %
Lymphocytes Absolute: 1.6 10*3/uL (ref 0.7–3.1)
Lymphs: 24 %
MCH: 28.6 pg (ref 26.6–33.0)
MCHC: 32.7 g/dL (ref 31.5–35.7)
MCV: 88 fL (ref 79–97)
Monocytes Absolute: 0.6 10*3/uL (ref 0.1–0.9)
Monocytes: 8 %
Neutrophils Absolute: 3.9 10*3/uL (ref 1.4–7.0)
Neutrophils: 59 %
Platelets: 236 10*3/uL (ref 150–450)
RBC: 4.16 x10E6/uL (ref 3.77–5.28)
RDW: 13.4 % (ref 11.7–15.4)
WBC: 6.7 10*3/uL (ref 3.4–10.8)

## 2022-11-19 LAB — CMP14+EGFR
ALT: 18 IU/L (ref 0–32)
AST: 29 IU/L (ref 0–40)
Albumin/Globulin Ratio: 1.9 (ref 1.2–2.2)
Albumin: 4.2 g/dL (ref 3.9–4.9)
Alkaline Phosphatase: 104 IU/L (ref 44–121)
BUN/Creatinine Ratio: 22 (ref 12–28)
BUN: 21 mg/dL (ref 8–27)
Bilirubin Total: 0.3 mg/dL (ref 0.0–1.2)
CO2: 19 mmol/L — ABNORMAL LOW (ref 20–29)
Calcium: 9.3 mg/dL (ref 8.7–10.3)
Chloride: 107 mmol/L — ABNORMAL HIGH (ref 96–106)
Creatinine, Ser: 0.94 mg/dL (ref 0.57–1.00)
Globulin, Total: 2.2 g/dL (ref 1.5–4.5)
Glucose: 85 mg/dL (ref 70–99)
Potassium: 4.1 mmol/L (ref 3.5–5.2)
Sodium: 143 mmol/L (ref 134–144)
Total Protein: 6.4 g/dL (ref 6.0–8.5)
eGFR: 69 mL/min/{1.73_m2} (ref >59)

## 2022-11-19 LAB — TSH: TSH: 4.19 u[IU]/mL (ref 0.450–4.500)

## 2022-11-21 ENCOUNTER — Encounter: Payer: Self-pay | Admitting: Family Medicine

## 2022-11-21 NOTE — Progress Notes (Signed)
Established Patient Office Visit  Subjective    Patient ID: Laura Reid, female    DOB: December 08, 1960  Age: 62 y.o. MRN: [id number]  CC:  Chief Complaint  Patient presents with   Follow-up    HPI Laura Reid presents for follow up of chronic med issues. Patient also reports that she has been experiencing fatigue.    Outpatient Encounter Medications as of 11/18/2022  Medication Sig   albuterol (VENTOLIN HFA) 108 (90 Base) MCG/ACT inhaler Inhale 2 puffs into the lungs every 6 (six) hours as needed for wheezing or shortness of breath.   amLODipine (NORVASC) 10 MG tablet TAKE 1 TABLET BY MOUTH EVERY DAY   diclofenac Sodium (VOLTAREN) 1 % GEL Apply topically.   gabapentin (NEURONTIN) 300 MG capsule Take by mouth.   hydrochlorothiazide (HYDRODIURIL) 25 MG tablet TAKE 1 TABLET (25 MG TOTAL) BY MOUTH DAILY.   losartan (COZAAR) 100 MG tablet TAKE 1 TABLET BY MOUTH EVERY DAY   metoprolol succinate (TOPROL-XL) 100 MG 24 hr tablet TAKE 1 TABLET BY MOUTH DAILY. TAKE WITH OR IMMEDIATELY FOLLOWING A MEAL.   Multiple Vitamins-Minerals (THERA-M) TABS Take 1 tablet by mouth daily.   nortriptyline (PAMELOR) 25 MG capsule TAKE 1 CAPSULE BY MOUTH AT BEDTIME.   omeprazole (PRILOSEC) 20 MG capsule Take 1 capsule (20 mg total) by mouth daily.   oxyCODONE-acetaminophen (PERCOCET) 10-325 MG tablet Take 1 tablet by mouth 4 (four) times daily as needed.   PARoxetine (PAXIL) 30 MG tablet TAKE 2 TABLETS BY MOUTH DAILY   tiZANidine (ZANAFLEX) 4 MG tablet Take 1/2 to 1 tablet up to three times daily as needed for muscle spasms   ALPRAZolam (XANAX) 0.5 MG tablet Take by mouth.   [DISCONTINUED] benzonatate (TESSALON) 200 MG capsule Take 1 capsule (200 mg total) by mouth 2 (two) times daily as needed for cough.   [DISCONTINUED] diclofenac Sodium (VOLTAREN) 1 % GEL Apply 2 g topically 4 (four) times daily.   [DISCONTINUED] metFORMIN (GLUCOPHAGE) 500 MG tablet TAKE 1 TABLET BY MOUTH TWICE A DAY   [DISCONTINUED]  predniSONE (DELTASONE) 10 MG tablet 6,5,4,3,2,1 take each days dose all at once in the morning with food   [DISCONTINUED] promethazine (PHENERGAN) 25 MG tablet Take 1 tablet (25 mg total) by mouth every 6 (six) hours as needed for nausea or vomiting.   No facility-administered encounter medications on file as of 11/18/2022.    No past medical history on file.  No past surgical history on file.  No family history on file.  Social History   Socioeconomic History   Marital status: Married    Spouse name: Not on file   Number of children: Not on file   Years of education: Not on file   Highest education level: Some college, no degree  Occupational History   Not on file  Tobacco Use   Smoking status: Never   Smokeless tobacco: Never  Substance and Sexual Activity   Alcohol use: Not Currently   Drug use: Not Currently   Sexual activity: Yes  Other Topics Concern   Not on file  Social History Narrative   Not on file   Social Determinants of Health   Financial Resource Strain: Low Risk  (11/14/2022)   Overall Financial Resource Strain (CARDIA)    Difficulty of Paying Living Expenses: Not very hard  Food Insecurity: No Food Insecurity (11/14/2022)   Hunger Vital Sign    Worried About Running Out of Food in the Last Year: Never true  Ran Out of Food in the Last Year: Never true  Transportation Needs: No Transportation Needs (11/14/2022)   PRAPARE - Administrator, Civil Service (Medical): No    Lack of Transportation (Non-Medical): No  Physical Activity: Insufficiently Active (11/14/2022)   Exercise Vital Sign    Days of Exercise per Week: 1 day    Minutes of Exercise per Session: 30 min  Stress: Stress Concern Present (11/14/2022)   [employer] of Occupational Health - Occupational Stress Questionnaire    Feeling of Stress : To some extent  Social Connections: Socially Integrated (11/14/2022)   Social Connection and Isolation Panel [NHANES]    Frequency of  Communication with Friends and Family: More than three times a week    Frequency of Social Gatherings with Friends and Family: Three times a week    Attends Religious Services: More than 4 times per year    Active Member of Clubs or Organizations: Yes    Attends Banker Meetings: More than 4 times per year    Marital Status: Married  Catering manager Violence: Not At Risk (01/24/2022)   Humiliation, Afraid, Rape, and Kick questionnaire    Fear of Current or Ex-Partner: No    Emotionally Abused: No    Physically Abused: No    Sexually Abused: No    Review of Systems  All other systems reviewed and are negative.       Objective    BP 138/81   Pulse (!) 58   Temp 98.1 F (36.7 C) (Oral)   Resp 16   Wt 246 lb 9.6 oz (111.9 kg)   SpO2 96%   BMI 39.80 kg/m   Physical Exam Vitals and nursing note reviewed.  Constitutional:      General: She is not in acute distress.    Appearance: She is obese.  Cardiovascular:     Rate and Rhythm: Normal rate and regular rhythm.  Pulmonary:     Effort: Pulmonary effort is normal.     Breath sounds: Normal breath sounds.  Abdominal:     Palpations: Abdomen is soft.     Tenderness: There is no abdominal tenderness.  Musculoskeletal:     Right lower leg: No edema.     Left lower leg: No edema.  Neurological:     General: No focal deficit present.     Mental Status: She is alert and oriented to person, place, and time.  Psychiatric:        Mood and Affect: Mood normal.        Behavior: Behavior normal.         Assessment & Plan:   1. Essential hypertension Appears stable. Continue   2. Other fatigue Monitoring labs ordered - TSH - CBC with Differential - CMP14+EGFR  3. Insomnia, unspecified type Continue     Return in about 3 months (around 02/17/2023) for follow up.   Tommie Raymond, MD

## 2022-12-09 ENCOUNTER — Telehealth: Payer: Self-pay | Admitting: Family Medicine

## 2022-12-09 NOTE — Telephone Encounter (Signed)
Contacted Sudan Bamber to schedule their annual wellness visit. Appointment made for 12/18/22.  Rudell Cobb AWV direct phone # [phone]

## 2022-12-09 NOTE — Telephone Encounter (Signed)
Called patient to schedule Medicare Annual Wellness Visit (AWV). Left message for patient to call back and schedule Medicare Annual Wellness Visit (AWV).  Last date of AWV: 01/24/22  If any questions, please contact me at [phone].  Thank you ,  Rudell Cobb AWV direct phone # [phone]

## 2022-12-18 ENCOUNTER — Ambulatory Visit (INDEPENDENT_AMBULATORY_CARE_PROVIDER_SITE_OTHER): Payer: Medicare HMO

## 2022-12-18 VITALS — Ht 66.0 in | Wt 251.4 lb

## 2022-12-18 DIAGNOSIS — Z0000 Encounter for general adult medical examination without abnormal findings: Secondary | ICD-10-CM | POA: Diagnosis not present

## 2022-12-18 NOTE — Progress Notes (Signed)
I connected with  Laura Reid on 12/18/22 by a audio enabled telemedicine application and verified that I am speaking with the correct person using two identifiers.  Patient Location: Home  Provider Location: Office/Clinic  I discussed the limitations of evaluation and management by telemedicine. The patient expressed understanding and agreed to proceed.  Subjective:   Laura Reid is a 62 y.o. female who presents for Medicare Annual (Subsequent) preventive examination.  Patient Medicare AWV questionnaire was completed by the patient on 12/15/2022; I have confirmed that all information answered by patient is correct and no changes since this date.     Review of Systems     Cardiac Risk Factors include: diabetes mellitus;dyslipidemia;hypertension;obesity (BMI >30kg/m2)     Objective:    Today's Vitals   12/18/22 1126 12/18/22 1127  Weight: 251 lb 6.4 oz (114 kg)   Height: 5\' 6"  (1.676 m)   PainSc:  7    Body mass index is 40.58 kg/m.     12/18/2022   11:30 AM  Advanced Directives  Does Patient Have a Medical Advance Directive? No    Current Medications (verified) Outpatient Encounter Medications as of 12/18/2022  Medication Sig   albuterol (VENTOLIN HFA) 108 (90 Base) MCG/ACT inhaler Inhale 2 puffs into the lungs every 6 (six) hours as needed for wheezing or shortness of breath.   amLODipine (NORVASC) 10 MG tablet TAKE 1 TABLET BY MOUTH EVERY DAY   diclofenac Sodium (VOLTAREN) 1 % GEL Apply topically.   gabapentin (NEURONTIN) 300 MG capsule Take by mouth.   hydrochlorothiazide (HYDRODIURIL) 25 MG tablet TAKE 1 TABLET (25 MG TOTAL) BY MOUTH DAILY.   losartan (COZAAR) 100 MG tablet TAKE 1 TABLET BY MOUTH EVERY DAY   metoprolol succinate (TOPROL-XL) 100 MG 24 hr tablet TAKE 1 TABLET BY MOUTH DAILY. TAKE WITH OR IMMEDIATELY FOLLOWING A MEAL.   Multiple Vitamins-Minerals (THERA-M) TABS Take 1 tablet by mouth daily.   nortriptyline (PAMELOR) 25 MG capsule TAKE 1 CAPSULE BY  MOUTH AT BEDTIME.   omeprazole (PRILOSEC) 20 MG capsule Take 1 capsule (20 mg total) by mouth daily.   oxyCODONE-acetaminophen (PERCOCET) 10-325 MG tablet Take 1 tablet by mouth 4 (four) times daily as needed.   PARoxetine (PAXIL) 30 MG tablet TAKE 2 TABLETS BY MOUTH DAILY   tiZANidine (ZANAFLEX) 4 MG tablet Take 1/2 to 1 tablet up to three times daily as needed for muscle spasms   No facility-administered encounter medications on file as of 12/18/2022.    Allergies (verified) Milnacipran, Duloxetine, Clindamycin, and Pregabalin   History: History reviewed. No pertinent past medical history. History reviewed. No pertinent surgical history. History reviewed. No pertinent family history. Social History   Socioeconomic History   Marital status: Married    Spouse name: Not on file   Number of children: Not on file   Years of education: Not on file   Highest education level: Some college, no degree  Occupational History   Not on file  Tobacco Use   Smoking status: Never   Smokeless tobacco: Never  Vaping Use   Vaping Use: Never used  Substance and Sexual Activity   Alcohol use: Not Currently   Drug use: Not Currently   Sexual activity: Yes  Other Topics Concern   Not on file  Social History Narrative   Not on file   Social Determinants of Health   Financial Resource Strain: Low Risk  (12/18/2022)   Overall Financial Resource Strain (CARDIA)    Difficulty of Paying Living Expenses:  Not hard at all  Food Insecurity: No Food Insecurity (12/18/2022)   Hunger Vital Sign    Worried About Running Out of Food in the Last Year: Never true    Ran Out of Food in the Last Year: Never true  Transportation Needs: No Transportation Needs (12/18/2022)   PRAPARE - Administrator, Civil Service (Medical): No    Lack of Transportation (Non-Medical): No  Physical Activity: Inactive (12/18/2022)   Exercise Vital Sign    Days of Exercise per Week: 0 days    Minutes of Exercise per  Session: 0 min  Stress: No Stress Concern Present (12/18/2022)   [employer] of Occupational Health - Occupational Stress Questionnaire    Feeling of Stress : Not at all  Recent Concern: Stress - Stress Concern Present (11/14/2022)   [employer] of Occupational Health - Occupational Stress Questionnaire    Feeling of Stress : To some extent  Social Connections: Socially Integrated (11/14/2022)   Social Connection and Isolation Panel [NHANES]    Frequency of Communication with Friends and Family: More than three times a week    Frequency of Social Gatherings with Friends and Family: Three times a week    Attends Religious Services: More than 4 times per year    Active Member of Clubs or Organizations: Yes    Attends Engineer, structural: More than 4 times per year    Marital Status: Married    Tobacco Counseling Counseling given: Not Answered   Clinical Intake:  Pre-visit preparation completed: Yes  Pain : 0-10 Pain Score: 7  Pain Type: Chronic pain Pain Location: Generalized Pain Descriptors / Indicators: Aching Pain Onset: More than a month ago Pain Frequency: Constant     Nutritional Status: BMI > 30  Obese Nutritional Risks: None Diabetes: Yes  How often do you need to have someone help you when you read instructions, pamphlets, or other written materials from your doctor or pharmacy?: 1 - Never  Diabetic? Yes Nutrition Risk Assessment:  Has the patient had any N/V/D within the last 2 months?  No  Does the patient have any non-healing wounds?  No  Has the patient had any unintentional weight loss or weight gain?  No   Diabetes:  Is the patient diabetic?  Yes  If diabetic, was a CBG obtained today?  No  Did the patient bring in their glucometer from home?  No  How often do you monitor your CBG's? Does not.   Financial Strains and Diabetes Management:  Are you having any financial strains with the device, your supplies or your medication? No .   Does the patient want to be seen by Chronic Care Management for management of their diabetes?  No  Would the patient like to be referred to a Nutritionist or for Diabetic Management?  No   Diabetic Exams:  Diabetic Eye Exam: Overdue for diabetic eye exam. Pt has been advised about the importance in completing this exam. Patient advised to call and schedule an eye exam. Diabetic Foot Exam: Overdue, Pt has been advised about the importance in completing this exam. Pt is scheduled for diabetic foot exam on next appointment.   Interpreter Needed?: No  Information entered by :: NAllen LPN   Activities of Daily Living    12/15/2022   12:02 PM 01/24/2022   12:34 PM  In your present state of health, do you have any difficulty performing the following activities:  Hearing? 0 0  Vision? 0 0  Difficulty concentrating or making decisions? 0 0  Walking or climbing stairs? 1 0  Dressing or bathing? 0 0  Doing errands, shopping? 0 0  Preparing Food and eating ? N   Using the Toilet? N   In the past six months, have you accidently leaked urine? N   Do you have problems with loss of bowel control? N   Managing your Medications? N   Managing your Finances? N   Housekeeping or managing your Housekeeping? N     Patient Care Team: Georganna Skeans, MD as PCP - General (Family Medicine)  Indicate any recent Medical Services you may have received from other than [facility] providers in the past year (date may be approximate).     Assessment:   This is a routine wellness examination for Laura.  Hearing/Vision screen Vision Screening - Comments:: Regular eye exams, [employer]  Dietary issues and exercise activities discussed: Current Exercise Habits: The patient does not participate in regular exercise at present   Goals Addressed             This Visit's Progress    Patient Stated       12/18/2022, no goals       Depression Screen    12/18/2022   11:31 AM 05/19/2022     3:06 PM 04/30/2022    3:17 PM 04/07/2022   10:47 AM 01/24/2022   12:27 PM 08/20/2021    1:25 PM  PHQ 2/9 Scores  PHQ - 2 Score 0 4 2 3 2  0  PHQ- 9 Score  13 11 10 11 1     Fall Risk    12/18/2022   11:31 AM 12/15/2022   12:02 PM 01/24/2022   12:32 PM  Fall Risk   Falls in the past year? 0 0 1  Number falls in past yr: 0  1  Injury with Fall? 0  0  Risk for fall due to : Medication side effect  History of fall(s)  Follow up Falls prevention discussed;Education provided;Falls evaluation completed  Falls evaluation completed    FALL RISK PREVENTION PERTAINING TO THE HOME:  Any stairs in or around the home? Yes  If so, are there any without handrails? No  Home free of loose throw rugs in walkways, pet beds, electrical cords, etc? Yes  Adequate lighting in your home to reduce risk of falls? Yes   ASSISTIVE DEVICES UTILIZED TO PREVENT FALLS:  Life alert? No  Use of a cane, walker or w/c? No  Grab bars in the bathroom? Yes  Shower chair or bench in shower? Yes  Elevated toilet seat or a handicapped toilet? No   TIMED UP AND GO:  Was the test performed? No .      Cognitive Function:        12/18/2022   11:32 AM 01/24/2022   12:35 PM  6CIT Screen  What Year? 0 points   What month? 0 points   What time? 0 points 0 points  Count back from 20 0 points 0 points  Months in reverse 0 points 0 points  Repeat phrase 2 points 2 points  Total Score 2 points     Immunizations Immunization History  Administered Date(s) Administered   Influenza-Unspecified 06/14/2022   Moderna Sars-Covid-2 Vaccination 12/09/2019, 01/04/2020, 08/06/2020   Zoster Recombinat (Shingrix) 07/27/2022, 10/05/2022    TDAP status: Up to date  Flu Vaccine status: Up to date  Pneumococcal vaccine status: Up to date  Covid-19 vaccine status: Completed vaccines  Qualifies for Shingles Vaccine? Yes   Zostavax completed Yes   Shingrix Completed?: Yes  Screening Tests Health Maintenance  Topic Date  Due   HEMOGLOBIN A1C  Never done   FOOT EXAM  Never done   OPHTHALMOLOGY EXAM  Never done   HIV Screening  Never done   Diabetic kidney evaluation - Urine ACR  Never done   Hepatitis C Screening  Never done   DTaP/Tdap/Td (1 - Tdap) Never done   COVID-19 Vaccine (4 - 2023-24 season) 04/11/2022   Medicare Annual Wellness (AWV)  01/25/2023   INFLUENZA VACCINE  03/12/2023   MAMMOGRAM  04/10/2023   Diabetic kidney evaluation - eGFR measurement  11/18/2023   Zoster Vaccines- Shingrix  Completed   HPV VACCINES  Aged Out   PAP SMEAR-Modifier  Discontinued   COLONOSCOPY (Pts 45-32yrs Insurance coverage will need to be confirmed)  Discontinued    Health Maintenance  Health Maintenance Due  Topic Date Due   HEMOGLOBIN A1C  Never done   FOOT EXAM  Never done   OPHTHALMOLOGY EXAM  Never done   HIV Screening  Never done   Diabetic kidney evaluation - Urine ACR  Never done   Hepatitis C Screening  Never done   DTaP/Tdap/Td (1 - Tdap) Never done   COVID-19 Vaccine (4 - 2023-24 season) 04/11/2022   Medicare Annual Wellness (AWV)  01/25/2023    Colorectal cancer screening: decline  Mammogram status: Completed 04/09/21. Repeat every 2 years  Bone Density status: n/a  Lung Cancer Screening: (Low Dose CT Chest recommended if Age 35-80 years, 30 pack-year currently smoking OR have quit w/in 15years.) does not qualify.   Lung Cancer Screening Referral: no  Additional Screening:  Hepatitis C Screening: does qualify;   Vision Screening: Recommended annual ophthalmology exams for early detection of glaucoma and other disorders of the eye. Is the patient up to date with their annual eye exam?  Yes  Who is the provider or what is the name of the office in which the patient attends annual eye exams? [facility] If pt is not established with a provider, would they like to be referred to a provider to establish care? No .   Dental Screening: Recommended annual dental exams for  proper oral hygiene  Community Resource Referral / Chronic Care Management: CRR required this visit?  No   CCM required this visit?  No      Plan:     I have personally reviewed and noted the following in the patient's chart:   Medical and social history Use of alcohol, tobacco or illicit drugs  Current medications and supplements including opioid prescriptions. Patient is not currently taking opioid prescriptions. Functional ability and status Nutritional status Physical activity Advanced directives List of other physicians Hospitalizations, surgeries, and ER visits in previous 12 months Vitals Screenings to include cognitive, depression, and falls Referrals and appointments  In addition, I have reviewed and discussed with patient certain preventive protocols, quality metrics, and best practice recommendations. A written personalized care plan for preventive services as well as general preventive health recommendations were provided to patient.     Barb Merino, LPN   08/16/1094   Nurse Notes: none  Due to this being a virtual visit, the after visit summary with patients personalized plan was offered to patient via mail or my-chart.  Patient would like to access on my-chart

## 2022-12-18 NOTE — Patient Instructions (Signed)
Laura Reid , Thank you for taking time to come for your Medicare Wellness Visit. I appreciate your ongoing commitment to your health goals. Please review the following plan we discussed and let me know if I can assist you in the future.   These are the goals we discussed:  Goals      Patient Stated     12/18/2022, no goals        This is a list of the screening recommended for you and due dates:  Health Maintenance  Topic Date Due   Hemoglobin A1C  Never done   Complete foot exam   Never done   Eye exam for diabetics  Never done   HIV Screening  Never done   Yearly kidney health urinalysis for diabetes  Never done   Hepatitis C Screening: USPSTF Recommendation to screen - Ages 66-79 yo.  Never done   DTaP/Tdap/Td vaccine (1 - Tdap) Never done   COVID-19 Vaccine (4 - 2023-24 season) 04/11/2022   Flu Shot  03/12/2023   Mammogram  04/10/2023   Yearly kidney function blood test for diabetes  11/18/2023   Medicare Annual Wellness Visit  12/18/2023   Zoster (Shingles) Vaccine  Completed   HPV Vaccine  Aged Out   Pap Smear  Discontinued   Colon Cancer Screening  Discontinued    Advanced directives: Advance directive discussed with you today.   Conditions/risks identified: none  Next appointment: Follow up in one year for your annual wellness visit.   Preventive Care 40-64 Years, Female Preventive care refers to lifestyle choices and visits with your health care provider that can promote health and wellness. What does preventive care include? A yearly physical exam. This is also called an annual well check. Dental exams once or twice a year. Routine eye exams. Ask your health care provider how often you should have your eyes checked. Personal lifestyle choices, including: Daily care of your teeth and gums. Regular physical activity. Eating a healthy diet. Avoiding tobacco and drug use. Limiting alcohol use. Practicing safe sex. Taking low-dose aspirin daily starting at age  7. Taking vitamin and mineral supplements as recommended by your health care provider. What happens during an annual well check? The services and screenings done by your health care provider during your annual well check will depend on your age, overall health, lifestyle risk factors, and family history of disease. Counseling  Your health care provider may ask you questions about your: Alcohol use. Tobacco use. Drug use. Emotional well-being. Home and relationship well-being. Sexual activity. Eating habits. Work and work Astronomer. Method of birth control. Menstrual cycle. Pregnancy history. Screening  You may have the following tests or measurements: Height, weight, and BMI. Blood pressure. Lipid and cholesterol levels. These may be checked every 5 years, or more frequently if you are over 46 years old. Skin check. Lung cancer screening. You may have this screening every year starting at age 26 if you have a 30-pack-year history of smoking and currently smoke or have quit within the past 15 years. Fecal occult blood test (FOBT) of the stool. You may have this test every year starting at age 24. Flexible sigmoidoscopy or colonoscopy. You may have a sigmoidoscopy every 5 years or a colonoscopy every 10 years starting at age 72. Hepatitis C blood test. Hepatitis B blood test. Sexually transmitted disease (STD) testing. Diabetes screening. This is done by checking your blood sugar (glucose) after you have not eaten for a while (fasting). You may have this  done every 1-3 years. Mammogram. This may be done every 1-2 years. Talk to your health care provider about when you should start having regular mammograms. This may depend on whether you have a family history of breast cancer. BRCA-related cancer screening. This may be done if you have a family history of breast, ovarian, tubal, or peritoneal cancers. Pelvic exam and Pap test. This may be done every 3 years starting at age 19.  Starting at age 58, this may be done every 5 years if you have a Pap test in combination with an HPV test. Bone density scan. This is done to screen for osteoporosis. You may have this scan if you are at high risk for osteoporosis. Discuss your test results, treatment options, and if necessary, the need for more tests with your health care provider. Vaccines  Your health care provider may recommend certain vaccines, such as: Influenza vaccine. This is recommended every year. Tetanus, diphtheria, and acellular pertussis (Tdap, Td) vaccine. You may need a Td booster every 10 years. Zoster vaccine. You may need this after age 94. Pneumococcal 13-valent conjugate (PCV13) vaccine. You may need this if you have certain conditions and were not previously vaccinated. Pneumococcal polysaccharide (PPSV23) vaccine. You may need one or two doses if you smoke cigarettes or if you have certain conditions. Talk to your health care provider about which screenings and vaccines you need and how often you need them. This information is not intended to replace advice given to you by your health care provider. Make sure you discuss any questions you have with your health care provider. Document Released: 08/24/2015 Document Revised: 04/16/2016 Document Reviewed: 05/29/2015 Elsevier Interactive Patient Education  2017 [employer] Prevention in the Home Falls can cause injuries. They can happen to people of all ages. There are many things you can do to make your home safe and to help prevent falls. What can I do on the outside of my home? Regularly fix the edges of walkways and driveways and fix any cracks. Remove anything that might make you trip as you walk through a door, such as a raised step or threshold. Trim any bushes or trees on the path to your home. Use bright outdoor lighting. Clear any walking paths of anything that might make someone trip, such as rocks or tools. Regularly check to see if  handrails are loose or broken. Make sure that both sides of any steps have handrails. Any raised decks and porches should have guardrails on the edges. Have any leaves, snow, or ice cleared regularly. Use sand or salt on walking paths during winter. Clean up any spills in your garage right away. This includes oil or grease spills. What can I do in the bathroom? Use night lights. Install grab bars by the toilet and in the tub and shower. Do not use towel bars as grab bars. Use non-skid mats or decals in the tub or shower. If you need to sit down in the shower, use a plastic, non-slip stool. Keep the floor dry. Clean up any water that spills on the floor as soon as it happens. Remove soap buildup in the tub or shower regularly. Attach bath mats securely with double-sided non-slip rug tape. Do not have throw rugs and other things on the floor that can make you trip. What can I do in the bedroom? Use night lights. Make sure that you have a light by your bed that is easy to reach. Do not use any  sheets or blankets that are too big for your bed. They should not hang down onto the floor. Have a firm chair that has side arms. You can use this for support while you get dressed. Do not have throw rugs and other things on the floor that can make you trip. What can I do in the kitchen? Clean up any spills right away. Avoid walking on wet floors. Keep items that you use a lot in easy-to-reach places. If you need to reach something above you, use a strong step stool that has a grab bar. Keep electrical cords out of the way. Do not use floor polish or wax that makes floors slippery. If you must use wax, use non-skid floor wax. Do not have throw rugs and other things on the floor that can make you trip. What can I do with my stairs? Do not leave any items on the stairs. Make sure that there are handrails on both sides of the stairs and use them. Fix handrails that are broken or loose. Make sure that  handrails are as long as the stairways. Check any carpeting to make sure that it is firmly attached to the stairs. Fix any carpet that is loose or worn. Avoid having throw rugs at the top or bottom of the stairs. If you do have throw rugs, attach them to the floor with carpet tape. Make sure that you have a light switch at the top of the stairs and the bottom of the stairs. If you do not have them, ask someone to add them for you. What else can I do to help prevent falls? Wear shoes that: Do not have high heels. Have rubber bottoms. Are comfortable and fit you well. Are closed at the toe. Do not wear sandals. If you use a stepladder: Make sure that it is fully opened. Do not climb a closed stepladder. Make sure that both sides of the stepladder are locked into place. Ask someone to hold it for you, if possible. Clearly mark and make sure that you can see: Any grab bars or handrails. First and last steps. Where the edge of each step is. Use tools that help you move around (mobility aids) if they are needed. These include: Canes. Walkers. Scooters. Crutches. Turn on the lights when you go into a dark area. Replace any light bulbs as soon as they burn out. Set up your furniture so you have a clear path. Avoid moving your furniture around. If any of your floors are uneven, fix them. If there are any pets around you, be aware of where they are. Review your medicines with your doctor. Some medicines can make you feel dizzy. This can increase your chance of falling. Ask your doctor what other things that you can do to help prevent falls. This information is not intended to replace advice given to you by your health care provider. Make sure you discuss any questions you have with your health care provider. Document Released: 05/24/2009 Document Revised: 01/03/2016 Document Reviewed: 09/01/2014 Elsevier Interactive Patient Education  2017 [employer].

## 2022-12-23 ENCOUNTER — Other Ambulatory Visit: Payer: Self-pay | Admitting: Family Medicine

## 2022-12-23 DIAGNOSIS — K219 Gastro-esophageal reflux disease without esophagitis: Secondary | ICD-10-CM

## 2022-12-23 DIAGNOSIS — I10 Essential (primary) hypertension: Secondary | ICD-10-CM

## 2022-12-23 NOTE — Telephone Encounter (Signed)
Requested Prescriptions  Pending Prescriptions Disp Refills   losartan (COZAAR) 100 MG tablet [Pharmacy Med Name: LOSARTAN POTASSIUM 100 MG TAB] 90 tablet 0    Sig: TAKE 1 TABLET BY MOUTH EVERY DAY     Cardiovascular:  Angiotensin Receptor Blockers Passed - 12/23/2022  1:47 PM      Passed - Cr in normal range and within 180 days    Creatinine, Ser  Date Value Ref Range Status  11/18/2022 0.94 0.57 - 1.00 mg/dL Final         Passed - K in normal range and within 180 days    Potassium  Date Value Ref Range Status  11/18/2022 4.1 3.5 - 5.2 mmol/L Final         Passed - Patient is not pregnant      Passed - Last BP in normal range    BP Readings from Last 1 Encounters:  11/18/22 138/81         Passed - Valid encounter within last 6 months    Recent Outpatient Visits           1 month ago Essential hypertension   [place] [facility], [place]   7 months ago COVID-19 long hauler manifesting chronic dyspnea   [place] [facility], [place]   7 months ago COVID-19   [facility], Cari S, PA-C   7 months ago URI, acute   [place] [facility] at [facility], Marzella Schlein, [place]   8 months ago Essential hypertension   [place] [facility] at [facility], MD       Future Appointments             In 1 month Georganna Skeans, MD [facility]             omeprazole (PRILOSEC) 20 MG capsule [Pharmacy Med Name: OMEPRAZOLE DR 20 MG CAPSULE] 90 capsule 1    Sig: TAKE 1 CAPSULE BY MOUTH EVERY DAY     Gastroenterology: Proton Pump Inhibitors Passed - 12/23/2022  1:47 PM      Passed - Valid encounter within last 12 months    Recent Outpatient Visits           1 month ago Essential hypertension   [place] [facility], [place]   7 months ago COVID-19 long hauler manifesting chronic dyspnea    [place] [facility], MD   7 months ago COVID-19   [facility], Kasandra Knudsen, PA-C   7 months ago URI, acute   [place] [facility] at [facility], Marzella Schlein, [place]   8 months ago Essential hypertension   [place] [facility] at [facility], MD       Future Appointments             In 1 month Georganna Skeans, MD [facility] at [facility]             metoprolol succinate (TOPROL-XL) 100 MG 24 hr tablet [Pharmacy Med Name: METOPROLOL SUCC ER 100 MG TAB] 90 tablet 0    Sig: TAKE 1 TABLET BY MOUTH EVERY DAY WITH OR IMMEDIATELY FOLLOWING A MEAL     Cardiovascular:  Beta Blockers Passed - 12/23/2022  1:47 PM  Passed - Last BP in normal range    BP Readings from Last 1 Encounters:  11/18/22 138/81         Passed - Last Heart Rate in normal range    Pulse Readings from Last 1 Encounters:  11/18/22 (!) 58         Passed - Valid encounter within last 6 months    Recent Outpatient Visits           1 month ago Essential hypertension   [place] [facility], MD   7 months ago COVID-19 long hauler manifesting chronic dyspnea   [place] [facility] at [facility], MD   7 months ago COVID-19   [facility] at [facility], Cari S, PA-C   7 months ago URI, acute   [place] [facility] at [facility], Marzella Schlein, [place]   8 months ago Essential hypertension   [place] [facility] at [facility], MD       Future Appointments             In 1 month Georganna Skeans, MD [facility] at [facility]             amLODipine (NORVASC) 10 MG tablet [Pharmacy Med Name: AMLODIPINE BESYLATE 10 MG TAB] 90 tablet 0    Sig: TAKE 1 TABLET BY MOUTH EVERY DAY     Cardiovascular: Calcium Channel Blockers 2 Passed - 12/23/2022  1:47 PM       Passed - Last BP in normal range    BP Readings from Last 1 Encounters:  11/18/22 138/81         Passed - Last Heart Rate in normal range    Pulse Readings from Last 1 Encounters:  11/18/22 (!) 58         Passed - Valid encounter within last 6 months    Recent Outpatient Visits           1 month ago Essential hypertension   [place] [facility], MD   7 months ago COVID-19 long hauler manifesting chronic dyspnea    [facility] at [facility], MD   7 months ago COVID-19   [facility] at [facility], Kasandra Knudsen, PA-C   7 months ago URI, acute   [facility] at [facility], Marzella Schlein, [place]   8 months ago Essential hypertension    Primary Care at [facility], MD       Future Appointments             In 1 month Georganna Skeans, MD [facility] at [facility]

## 2023-01-06 ENCOUNTER — Other Ambulatory Visit: Payer: Self-pay | Admitting: Family Medicine

## 2023-02-18 ENCOUNTER — Ambulatory Visit: Payer: Medicare HMO | Admitting: Family Medicine

## 2023-02-19 ENCOUNTER — Encounter: Payer: Self-pay | Admitting: Family Medicine

## 2023-02-19 ENCOUNTER — Ambulatory Visit: Payer: Medicare HMO | Admitting: Family Medicine

## 2023-02-19 VITALS — BP 124/80 | HR 55 | Temp 98.7°F | Ht 66.0 in | Wt 250.8 lb

## 2023-02-19 DIAGNOSIS — E119 Type 2 diabetes mellitus without complications: Secondary | ICD-10-CM | POA: Diagnosis not present

## 2023-02-19 DIAGNOSIS — I10 Essential (primary) hypertension: Secondary | ICD-10-CM | POA: Diagnosis not present

## 2023-02-19 NOTE — Progress Notes (Signed)
Pt is asking for an a1c

## 2023-02-23 ENCOUNTER — Encounter: Payer: Self-pay | Admitting: Family Medicine

## 2023-02-23 LAB — POCT GLYCOSYLATED HEMOGLOBIN (HGB A1C): Hemoglobin A1C: 5.5 % (ref 4.0–5.6)

## 2023-02-23 NOTE — Progress Notes (Signed)
Established Patient Office Visit  Subjective    Patient ID: Laura Reid, female    DOB: 06-18-1961  Age: 62 y.o. MRN: [id number]  CC:  Chief Complaint  Patient presents with   Medication Management    HPI Laura Reid presents for follow up of chronic med issues. She denies acute complaints or concerns.    Outpatient Encounter Medications as of 02/19/2023  Medication Sig   albuterol (VENTOLIN HFA) 108 (90 Base) MCG/ACT inhaler Inhale 2 puffs into the lungs every 6 (six) hours as needed for wheezing or shortness of breath.   amLODipine (NORVASC) 10 MG tablet TAKE 1 TABLET BY MOUTH EVERY DAY   gabapentin (NEURONTIN) 300 MG capsule Take by mouth.   hydrochlorothiazide (HYDRODIURIL) 25 MG tablet TAKE 1 TABLET (25 MG TOTAL) BY MOUTH DAILY.   losartan (COZAAR) 100 MG tablet TAKE 1 TABLET BY MOUTH EVERY DAY   metoprolol succinate (TOPROL-XL) 100 MG 24 hr tablet TAKE 1 TABLET BY MOUTH EVERY DAY WITH OR IMMEDIATELY FOLLOWING A MEAL   Multiple Vitamins-Minerals (THERA-M) TABS Take 1 tablet by mouth daily.   nortriptyline (PAMELOR) 25 MG capsule TAKE 1 CAPSULE BY MOUTH AT BEDTIME.   omeprazole (PRILOSEC) 20 MG capsule TAKE 1 CAPSULE BY MOUTH EVERY DAY   oxyCODONE-acetaminophen (PERCOCET) 10-325 MG tablet Take 1 tablet by mouth 4 (four) times daily as needed.   PARoxetine (PAXIL) 30 MG tablet TAKE 2 TABLETS BY MOUTH EVERY DAY   tiZANidine (ZANAFLEX) 4 MG tablet Take 1/2 to 1 tablet up to three times daily as needed for muscle spasms   No facility-administered encounter medications on file as of 02/19/2023.    History reviewed. No pertinent past medical history.  History reviewed. No pertinent surgical history.  History reviewed. No pertinent family history.  Social History   Socioeconomic History   Marital status: Married    Spouse name: Not on file   Number of children: Not on file   Years of education: Not on file   Highest education level: Some college, no degree   Occupational History   Not on file  Tobacco Use   Smoking status: Never   Smokeless tobacco: Never  Vaping Use   Vaping status: Never Used  Substance and Sexual Activity   Alcohol use: Not Currently   Drug use: Not Currently   Sexual activity: Yes  Other Topics Concern   Not on file  Social History Narrative   Not on file   Social Determinants of Health   Financial Resource Strain: Low Risk  (12/18/2022)   Overall Financial Resource Strain (CARDIA)    Difficulty of Paying Living Expenses: Not hard at all  Food Insecurity: No Food Insecurity (12/18/2022)   Hunger Vital Sign    Worried About Running Out of Food in the Last Year: Never true    Ran Out of Food in the Last Year: Never true  Transportation Needs: No Transportation Needs (12/18/2022)   PRAPARE - Administrator, Civil Service (Medical): No    Lack of Transportation (Non-Medical): No  Physical Activity: Inactive (12/18/2022)   Exercise Vital Sign    Days of Exercise per Week: 0 days    Minutes of Exercise per Session: 0 min  Stress: No Stress Concern Present (12/18/2022)   [employer] of Occupational Health - Occupational Stress Questionnaire    Feeling of Stress : Not at all  Recent Concern: Stress - Stress Concern Present (11/14/2022)   [employer] of Occupational Health - Occupational Stress  Questionnaire    Feeling of Stress : To some extent  Social Connections: Socially Integrated (11/14/2022)   Social Connection and Isolation Panel [NHANES]    Frequency of Communication with Friends and Family: More than three times a week    Frequency of Social Gatherings with Friends and Family: Three times a week    Attends Religious Services: More than 4 times per year    Active Member of Clubs or Organizations: Yes    Attends Banker Meetings: More than 4 times per year    Marital Status: Married  Catering manager Violence: Not At Risk (01/24/2022)   Humiliation, Afraid, Rape, and Kick  questionnaire    Fear of Current or Ex-Partner: No    Emotionally Abused: No    Physically Abused: No    Sexually Abused: No    Review of Systems  All other systems reviewed and are negative.       Objective    BP 124/80   Pulse (!) 55   Temp 98.7 F (37.1 C) (Oral)   Ht 5\' 6"  (1.676 m)   Wt 250 lb 12.8 oz (113.8 kg)   SpO2 94%   BMI 40.48 kg/m   Physical Exam Vitals and nursing note reviewed.  Constitutional:      General: She is not in acute distress.    Appearance: She is obese.  Cardiovascular:     Rate and Rhythm: Normal rate and regular rhythm.  Pulmonary:     Effort: Pulmonary effort is normal.     Breath sounds: Normal breath sounds.  Abdominal:     Palpations: Abdomen is soft.     Tenderness: There is no abdominal tenderness.  Musculoskeletal:     Right lower leg: No edema.     Left lower leg: No edema.  Neurological:     General: No focal deficit present.     Mental Status: She is alert and oriented to person, place, and time.  Psychiatric:        Mood and Affect: Mood normal.        Behavior: Behavior normal.         Assessment & Plan:   1. Type 2 diabetes mellitus without complication, without long-term current use of insulin (HCC) A1c is wnl. Continue  - POCT glycosylated hemoglobin (Hb A1C)  2. Essential hypertension Appears stable. Continue   Return in about 6 months (around 08/22/2023) for follow up.   Tommie Raymond, MD

## 2023-03-14 ENCOUNTER — Other Ambulatory Visit: Payer: Self-pay | Admitting: Family Medicine

## 2023-03-14 DIAGNOSIS — I10 Essential (primary) hypertension: Secondary | ICD-10-CM

## 2023-03-23 ENCOUNTER — Telehealth: Payer: Self-pay | Admitting: Family Medicine

## 2023-03-23 NOTE — Telephone Encounter (Signed)
Copied from CRM [id number]. Topic: Appointment Scheduling - Scheduling Inquiry for Clinic >> Mar 23, 2023 11:03 AM Franchot Heidelberg wrote: Reason for CRM: Pt called to schedule a hosp fu, nothing soon enough. Please advise  Pt was seen for Shingles in [facility]   Had shoulder surgery at [facility] and then two weeks later she developed a rash

## 2023-03-23 NOTE — Telephone Encounter (Signed)
Called pt to schedule hospital follow-up; advised that pcp does not have any availability within the next two weeks. I offered a visit slot for 08/14 with our nurse practitioner and pt refused because of work. Pt stated she has a visit with her surgeon within the next two weeks and said she does not need appt anymore. Advised to call office if she needs to follow up after seeing surgeon.

## 2023-03-24 ENCOUNTER — Encounter: Payer: Self-pay | Admitting: Family Medicine

## 2023-03-25 NOTE — Telephone Encounter (Signed)
Patient has been given an appt for provider

## 2023-03-26 ENCOUNTER — Ambulatory Visit (INDEPENDENT_AMBULATORY_CARE_PROVIDER_SITE_OTHER): Payer: Medicare HMO | Admitting: Family Medicine

## 2023-03-26 ENCOUNTER — Other Ambulatory Visit: Payer: Self-pay | Admitting: Family Medicine

## 2023-03-26 VITALS — BP 123/81 | HR 63 | Temp 98.1°F | Resp 16 | Wt 255.6 lb

## 2023-03-26 DIAGNOSIS — Z96611 Presence of right artificial shoulder joint: Secondary | ICD-10-CM

## 2023-03-26 DIAGNOSIS — B029 Zoster without complications: Secondary | ICD-10-CM

## 2023-03-26 NOTE — Progress Notes (Signed)
Established Patient Office Visit  Subjective    Patient ID: Laura Reid, female    DOB: March 14, 1961  Age: 62 y.o. MRN: [id number]  CC: No chief complaint on file.   HPI Sudan Amodio presents for follow up of ED visit where she ws diagnosed with shingles.  patient had a shoulder replacement on February 25, 2023.     Outpatient Encounter Medications as of 03/26/2023  Medication Sig   acetaminophen (TYLENOL) 650 MG CR tablet Take by mouth.   albuterol (VENTOLIN HFA) 108 (90 Base) MCG/ACT inhaler Inhale 2 puffs into the lungs every 6 (six) hours as needed for wheezing or shortness of breath.   amLODipine (NORVASC) 10 MG tablet TAKE 1 TABLET BY MOUTH EVERY DAY   diclofenac Sodium (VOLTAREN) 1 % GEL Apply topically.   Docusate Sodium (DSS) 100 MG CAPS Take 1 capsule by mouth 2 (two) times daily.   gabapentin (NEURONTIN) 300 MG capsule Take by mouth.   hydrochlorothiazide (HYDRODIURIL) 25 MG tablet TAKE 1 TABLET (25 MG TOTAL) BY MOUTH DAILY.   losartan (COZAAR) 100 MG tablet TAKE 1 TABLET BY MOUTH EVERY DAY   methylPREDNISolone (MEDROL DOSEPAK) 4 MG TBPK tablet See admin instructions.   metoprolol succinate (TOPROL-XL) 100 MG 24 hr tablet TAKE 1 TABLET BY MOUTH EVERY DAY WITH OR IMMEDIATELY FOLLOWING A MEAL   Multiple Vitamins-Minerals (THERA-M) TABS Take 1 tablet by mouth daily.   nortriptyline (PAMELOR) 25 MG capsule TAKE 1 CAPSULE BY MOUTH AT BEDTIME.   omeprazole (PRILOSEC) 20 MG capsule TAKE 1 CAPSULE BY MOUTH EVERY DAY   ondansetron (ZOFRAN-ODT) 8 MG disintegrating tablet Take 8 mg by mouth every 8 (eight) hours as needed.   oxyCODONE-acetaminophen (PERCOCET) 10-325 MG tablet Take 1 tablet by mouth 4 (four) times daily as needed.   PARoxetine (PAXIL) 30 MG tablet TAKE 2 TABLETS BY MOUTH EVERY DAY   tiZANidine (ZANAFLEX) 4 MG tablet Take 1/2 to 1 tablet up to three times daily as needed for muscle spasms   valACYclovir (VALTREX) 1000 MG tablet Take by mouth.   No  facility-administered encounter medications on file as of 03/26/2023.    No past medical history on file.  No past surgical history on file.  No family history on file.  Social History   Socioeconomic History   Marital status: Married    Spouse name: Not on file   Number of children: Not on file   Years of education: Not on file   Highest education level: Some college, no degree  Occupational History   Not on file  Tobacco Use   Smoking status: Never   Smokeless tobacco: Never  Vaping Use   Vaping status: Never Used  Substance and Sexual Activity   Alcohol use: Not Currently   Drug use: Not Currently   Sexual activity: Yes  Other Topics Concern   Not on file  Social History Narrative   Not on file   Social Determinants of Health   Financial Resource Strain: Low Risk  (12/18/2022)   Overall Financial Resource Strain (CARDIA)    Difficulty of Paying Living Expenses: Not hard at all  Food Insecurity: No Food Insecurity (12/18/2022)   Hunger Vital Sign    Worried About Running Out of Food in the Last Year: Never true    Ran Out of Food in the Last Year: Never true  Transportation Needs: No Transportation Needs (12/18/2022)   PRAPARE - Transportation    Lack of Transportation (Medical): No    Lack of  Transportation (Non-Medical): No  Physical Activity: Inactive (12/18/2022)   Exercise Vital Sign    Days of Exercise per Week: 0 days    Minutes of Exercise per Session: 0 min  Stress: No Stress Concern Present (12/18/2022)   [employer] of Occupational Health - Occupational Stress Questionnaire    Feeling of Stress : Not at all  Recent Concern: Stress - Stress Concern Present (11/14/2022)   [employer] of Occupational Health - Occupational Stress Questionnaire    Feeling of Stress : To some extent  Social Connections: Socially Integrated (11/14/2022)   Social Connection and Isolation Panel [NHANES]    Frequency of Communication with Friends and Family: More than three  times a week    Frequency of Social Gatherings with Friends and Family: Three times a week    Attends Religious Services: More than 4 times per year    Active Member of Clubs or Organizations: Yes    Attends Banker Meetings: More than 4 times per year    Marital Status: Married  Catering manager Violence: Not At Risk (01/24/2022)   Humiliation, Afraid, Rape, and Kick questionnaire    Fear of Current or Ex-Partner: No    Emotionally Abused: No    Physically Abused: No    Sexually Abused: No    Review of Systems  All other systems reviewed and are negative.       Objective    BP 123/81   Pulse 63   Temp 98.1 F (36.7 C) (Oral)   Resp 16   Wt 255 lb 9.6 oz (115.9 kg)   SpO2 94%   BMI 41.25 kg/m   Physical Exam Vitals and nursing note reviewed.  Constitutional:      General: She is not in acute distress. Cardiovascular:     Rate and Rhythm: Normal rate and regular rhythm.  Pulmonary:     Effort: Pulmonary effort is normal.     Breath sounds: Normal breath sounds.  Skin:    Comments: Vesicular lesions on erythematous base along dermatome on upper right side. Most with crusting/scabbing  Neurological:     General: No focal deficit present.     Mental Status: She is alert and oriented to person, place, and time.         Assessment & Plan:   1. History of right shoulder replacement Improving per patient. Management as per consultant.   2. Herpes zoster without complication Appears to be resolving. Complete course of valtrex. Continue pain meds prn.     No follow-ups on file.   Tommie Raymond, MD

## 2023-03-30 ENCOUNTER — Encounter: Payer: Self-pay | Admitting: Family Medicine

## 2023-05-25 ENCOUNTER — Encounter: Payer: Self-pay | Admitting: Family Medicine

## 2023-05-25 ENCOUNTER — Ambulatory Visit (INDEPENDENT_AMBULATORY_CARE_PROVIDER_SITE_OTHER): Payer: Medicare HMO | Admitting: Family Medicine

## 2023-05-25 VITALS — BP 128/82 | HR 56 | Temp 98.0°F | Resp 16 | Ht 66.0 in | Wt 253.4 lb

## 2023-05-25 DIAGNOSIS — F32A Depression, unspecified: Secondary | ICD-10-CM

## 2023-05-25 DIAGNOSIS — G4733 Obstructive sleep apnea (adult) (pediatric): Secondary | ICD-10-CM

## 2023-05-25 DIAGNOSIS — R5383 Other fatigue: Secondary | ICD-10-CM

## 2023-05-25 DIAGNOSIS — E66813 Obesity, class 3: Secondary | ICD-10-CM

## 2023-05-25 DIAGNOSIS — Z6841 Body Mass Index (BMI) 40.0 and over, adult: Secondary | ICD-10-CM

## 2023-05-25 DIAGNOSIS — Z23 Encounter for immunization: Secondary | ICD-10-CM | POA: Diagnosis not present

## 2023-05-25 DIAGNOSIS — S46219A Strain of muscle, fascia and tendon of other parts of biceps, unspecified arm, initial encounter: Secondary | ICD-10-CM

## 2023-05-25 NOTE — Progress Notes (Unsigned)
Established Patient Office Visit  Subjective    Patient ID: Laura Reid, female    DOB: [birth year]  Age: 62 y.o. MRN: [id number]  CC:  Chief Complaint  Patient presents with   Follow-up    Diabetes, post shingles    HPI Sudan Christley presents for pain from bicep tendon repair. She reports that ortho has indicated that the repair has failed. She also reports that she would like some help with her depression. She is on meds but was unable to afford the trintellix that had been working the best in the past.   Outpatient Encounter Medications as of 05/25/2023  Medication Sig   acetaminophen (TYLENOL) 650 MG CR tablet Take by mouth.   albuterol (VENTOLIN HFA) 108 (90 Base) MCG/ACT inhaler Inhale 2 puffs into the lungs every 6 (six) hours as needed for wheezing or shortness of breath.   amLODipine (NORVASC) 10 MG tablet TAKE 1 TABLET BY MOUTH EVERY DAY   diclofenac Sodium (VOLTAREN) 1 % GEL Apply topically.   Docusate Sodium (DSS) 100 MG CAPS Take 1 capsule by mouth 2 (two) times daily.   gabapentin (NEURONTIN) 300 MG capsule Take by mouth.   hydrochlorothiazide (HYDRODIURIL) 25 MG tablet TAKE 1 TABLET (25 MG TOTAL) BY MOUTH DAILY.   losartan (COZAAR) 100 MG tablet TAKE 1 TABLET BY MOUTH EVERY DAY   methylPREDNISolone (MEDROL DOSEPAK) 4 MG TBPK tablet See admin instructions.   metoprolol succinate (TOPROL-XL) 100 MG 24 hr tablet TAKE 1 TABLET BY MOUTH EVERY DAY WITH OR IMMEDIATELY FOLLOWING A MEAL   Multiple Vitamins-Minerals (THERA-M) TABS Take 1 tablet by mouth daily.   nortriptyline (PAMELOR) 25 MG capsule TAKE 1 CAPSULE BY MOUTH AT BEDTIME.   omeprazole (PRILOSEC) 20 MG capsule TAKE 1 CAPSULE BY MOUTH EVERY DAY   ondansetron (ZOFRAN-ODT) 8 MG disintegrating tablet Take 8 mg by mouth every 8 (eight) hours as needed.   oxyCODONE-acetaminophen (PERCOCET) 10-325 MG tablet Take 1 tablet by mouth 4 (four) times daily as needed.   PARoxetine (PAXIL) 30 MG tablet TAKE 2 TABLETS BY  MOUTH EVERY DAY   tiZANidine (ZANAFLEX) 4 MG tablet Take 1/2 to 1 tablet up to three times daily as needed for muscle spasms   No facility-administered encounter medications on file as of 05/25/2023.    History reviewed. No pertinent past medical history.  History reviewed. No pertinent surgical history.  History reviewed. No pertinent family history.  Social History   Socioeconomic History   Marital status: Married    Spouse name: Not on file   Number of children: Not on file   Years of education: Not on file   Highest education level: Some college, no degree  Occupational History   Not on file  Tobacco Use   Smoking status: Never   Smokeless tobacco: Never  Vaping Use   Vaping status: Never Used  Substance and Sexual Activity   Alcohol use: Not Currently   Drug use: Not Currently   Sexual activity: Yes  Other Topics Concern   Not on file  Social History Narrative   Not on file   Social Determinants of Health   Financial Resource Strain: Low Risk  (12/18/2022)   Overall Financial Resource Strain (CARDIA)    Difficulty of Paying Living Expenses: Not hard at all  Food Insecurity: No Food Insecurity (12/18/2022)   Hunger Vital Sign    Worried About Running Out of Food in the Last Year: Never true    Ran Out of Food in the  Last Year: Never true  Transportation Needs: No Transportation Needs (12/18/2022)   PRAPARE - Administrator, Civil Service (Medical): No    Lack of Transportation (Non-Medical): No  Physical Activity: Inactive (12/18/2022)   Exercise Vital Sign    Days of Exercise per Week: 0 days    Minutes of Exercise per Session: 0 min  Stress: No Stress Concern Present (12/18/2022)   [employer] of Occupational Health - Occupational Stress Questionnaire    Feeling of Stress : Not at all  Recent Concern: Stress - Stress Concern Present (11/14/2022)   [employer] of Occupational Health - Occupational Stress Questionnaire    Feeling of Stress : To  some extent  Social Connections: Socially Integrated (11/14/2022)   Social Connection and Isolation Panel [NHANES]    Frequency of Communication with Friends and Family: More than three times a week    Frequency of Social Gatherings with Friends and Family: Three times a week    Attends Religious Services: More than 4 times per year    Active Member of Clubs or Organizations: Yes    Attends Banker Meetings: More than 4 times per year    Marital Status: Married  Catering manager Violence: Not At Risk (01/24/2022)   Humiliation, Afraid, Rape, and Kick questionnaire    Fear of Current or Ex-Partner: No    Emotionally Abused: No    Physically Abused: No    Sexually Abused: No    Review of Systems  Constitutional:  Positive for malaise/fatigue. Negative for chills, fever and weight loss.  Psychiatric/Behavioral:  Positive for depression. Negative for suicidal ideas. The patient is not nervous/anxious.   All other systems reviewed and are negative.       Objective    BP 128/82 (BP Location: Right Arm, Patient Position: Sitting, Cuff Size: Large)   Pulse (!) 56   Temp 98 F (36.7 C) (Oral)   Resp 16   Ht 5\' 6"  (1.676 m)   Wt 253 lb 6.4 oz (114.9 kg)   SpO2 93%   BMI 40.90 kg/m   Physical Exam Vitals and nursing note reviewed.  Constitutional:      General: She is not in acute distress.    Appearance: She is obese.  Cardiovascular:     Rate and Rhythm: Normal rate and regular rhythm.  Pulmonary:     Effort: Pulmonary effort is normal.     Breath sounds: Normal breath sounds.  Abdominal:     Palpations: Abdomen is soft.     Tenderness: There is no abdominal tenderness.  Neurological:     General: No focal deficit present.     Mental Status: She is alert and oriented to person, place, and time.         Assessment & Plan:   1. Other fatigue Patient believes may be 2/2 mood  2. OSA (obstructive sleep apnea) Patient using cpap  3. Class 3 severe  obesity due to excess calories with serious comorbidity and body mass index (BMI) of 40.0 to 44.9 in adult (HCC)   4. Depression, unspecified depression type Trintellix samples given.  5. Biceps tendon tear Management per consultant  6. Encounter for immunization  - Flu vaccine trivalent PF, 6mos and older(Flulaval,Afluria,Fluarix,Fluzone)    No follow-ups on file.   Tommie Raymond, MD

## 2023-05-27 ENCOUNTER — Encounter: Payer: Self-pay | Admitting: Family Medicine

## 2023-06-09 ENCOUNTER — Other Ambulatory Visit: Payer: Self-pay | Admitting: Family Medicine

## 2023-06-09 ENCOUNTER — Encounter: Payer: Self-pay | Admitting: Family Medicine

## 2023-06-09 MED ORDER — VORTIOXETINE HBR 20 MG PO TABS: 20.0000 mg | ORAL_TABLET | Freq: Every day | ORAL | 0 refills | Status: AC

## 2023-06-09 NOTE — Progress Notes (Signed)
trn

## 2023-06-12 ENCOUNTER — Other Ambulatory Visit: Payer: Self-pay | Admitting: Family Medicine

## 2023-06-12 ENCOUNTER — Other Ambulatory Visit: Payer: Self-pay

## 2023-06-12 DIAGNOSIS — I10 Essential (primary) hypertension: Secondary | ICD-10-CM

## 2023-06-12 MED ORDER — HYDROCHLOROTHIAZIDE 25 MG PO TABS: 25.0000 mg | ORAL_TABLET | Freq: Every day | ORAL | 1 refills | Status: DC

## 2023-06-29 ENCOUNTER — Telehealth: Payer: Medicare HMO | Admitting: Family Medicine

## 2023-06-29 DIAGNOSIS — F32A Depression, unspecified: Secondary | ICD-10-CM | POA: Diagnosis not present

## 2023-06-29 DIAGNOSIS — N200 Calculus of kidney: Secondary | ICD-10-CM | POA: Diagnosis not present

## 2023-06-29 NOTE — Progress Notes (Unsigned)
Virtual Visit via Video Note  I connected with Laura Reid on 06/29/23 at 11:00 AM EST by a video enabled telemedicine application and verified that I am speaking with the correct person using two identifiers.  Location: Patient: [name] Provider: [place]   I discussed the limitations of evaluation and management by telemedicine and the availability of in person appointments. The patient expressed understanding and agreed to proceed.  History of Present Illness: Patient reports that she is now taking 20 mg of trintellix for depression. She reports that she does not feel like it is working as well as when she previously took it but she is ok. She also reports that she had right sided kidney stone in the past and was supposed to have follow up.    Observations/Objective:   Assessment and Plan: 1. Depression, unspecified depression type Patient wants to continue with present management and follow up.   2. Right nephrolithiasis Patient to follow up with consultant who was managing.    Follow Up Instructions: One month   I discussed the assessment and treatment plan with the patient. The patient was provided an opportunity to ask questions and all were answered. The patient agreed with the plan and demonstrated an understanding of the instructions.   The patient was advised to call back or seek an in-person evaluation if the symptoms worsen or if the condition fails to improve as anticipated.  I provided 9 minutes of non-face-to-face time during this encounter.   Tommie Raymond, MD

## 2023-07-01 ENCOUNTER — Encounter: Payer: Self-pay | Admitting: Family Medicine

## 2023-07-05 ENCOUNTER — Other Ambulatory Visit: Payer: Self-pay | Admitting: Family Medicine

## 2023-07-05 DIAGNOSIS — K219 Gastro-esophageal reflux disease without esophagitis: Secondary | ICD-10-CM

## 2023-07-09 ENCOUNTER — Encounter: Payer: Self-pay | Admitting: Family Medicine

## 2023-07-14 ENCOUNTER — Other Ambulatory Visit: Payer: Self-pay | Admitting: Family Medicine

## 2023-07-14 MED ORDER — PAROXETINE HCL 30 MG PO TABS: 60.0000 mg | ORAL_TABLET | Freq: Every day | ORAL | 0 refills | Status: DC

## 2023-07-19 ENCOUNTER — Other Ambulatory Visit: Payer: Self-pay | Admitting: Family Medicine

## 2023-07-19 DIAGNOSIS — I10 Essential (primary) hypertension: Secondary | ICD-10-CM

## 2023-08-11 ENCOUNTER — Other Ambulatory Visit: Payer: Self-pay | Admitting: Family Medicine

## 2023-08-11 DIAGNOSIS — I10 Essential (primary) hypertension: Secondary | ICD-10-CM

## 2023-08-29 ENCOUNTER — Other Ambulatory Visit: Payer: Self-pay | Admitting: Family Medicine

## 2023-08-29 DIAGNOSIS — I10 Essential (primary) hypertension: Secondary | ICD-10-CM

## 2023-11-25 ENCOUNTER — Other Ambulatory Visit: Payer: Self-pay | Admitting: Family Medicine

## 2023-11-26 ENCOUNTER — Other Ambulatory Visit: Payer: Self-pay | Admitting: Family Medicine

## 2023-11-26 DIAGNOSIS — I10 Essential (primary) hypertension: Secondary | ICD-10-CM

## 2023-12-03 ENCOUNTER — Other Ambulatory Visit: Payer: Self-pay | Admitting: Family Medicine

## 2023-12-03 DIAGNOSIS — I10 Essential (primary) hypertension: Secondary | ICD-10-CM

## 2023-12-04 ENCOUNTER — Other Ambulatory Visit: Payer: Self-pay | Admitting: Family Medicine

## 2023-12-04 DIAGNOSIS — I10 Essential (primary) hypertension: Secondary | ICD-10-CM

## 2023-12-05 ENCOUNTER — Other Ambulatory Visit: Payer: Self-pay | Admitting: Family Medicine

## 2023-12-05 DIAGNOSIS — K219 Gastro-esophageal reflux disease without esophagitis: Secondary | ICD-10-CM

## 2023-12-17 ENCOUNTER — Ambulatory Visit: Payer: Medicare Other

## 2023-12-17 VITALS — BP 128/82 | Ht 66.0 in | Wt 250.0 lb

## 2023-12-17 DIAGNOSIS — Z0000 Encounter for general adult medical examination without abnormal findings: Secondary | ICD-10-CM | POA: Diagnosis not present

## 2023-12-17 DIAGNOSIS — Z2821 Immunization not carried out because of patient refusal: Secondary | ICD-10-CM

## 2023-12-17 DIAGNOSIS — Z5320 Procedure and treatment not carried out because of patient's decision for unspecified reasons: Secondary | ICD-10-CM

## 2023-12-17 DIAGNOSIS — E119 Type 2 diabetes mellitus without complications: Secondary | ICD-10-CM

## 2023-12-17 NOTE — Patient Instructions (Signed)
 Ms. Lollar , Thank you for taking time out of your busy schedule to complete your Annual Wellness Visit with me. I enjoyed our conversation and look forward to speaking with you again next year. I, as well as your care team,  appreciate your ongoing commitment to your health goals. Please review the following plan we discussed and let me know if I can assist you in the future. Your Game plan/ To Do List    Referrals: If you haven't heard from the office you've been referred to, please reach out to them at the phone provided.  A1c, foot exam and diabetic kidney evaluation ordered Follow up Visits: Next Medicare AWV with our clinical staff: 12/29/2024   Have you seen your provider in the last 6 months (3 months if uncontrolled diabetes)? Yes Next Office Visit with your provider: N/a  Clinician Recommendations:  Aim for 30 minutes of exercise or brisk walking, 6-8 glasses of water, and 5 servings of fruits and vegetables each day.       This is a list of the screening recommended for you and due dates:  Health Maintenance  Topic Date Due   Complete foot exam   Never done   HIV Screening  Never done   Yearly kidney health urinalysis for diabetes  Never done   Hepatitis C Screening  Never done   DTaP/Tdap/Td vaccine (1 - Tdap) Never done   Pneumococcal Vaccination (1 of 2 - PCV) Never done   Pap with HPV screening  Never done   Mammogram  04/10/2023   Hemoglobin A1C  08/26/2023   Yearly kidney function blood test for diabetes  11/18/2023   COVID-19 Vaccine (4 - 2024-25 season) 05/23/2024*   Eye exam for diabetics  01/20/2024   Flu Shot  03/11/2024   Medicare Annual Wellness Visit  12/16/2024   Zoster (Shingles) Vaccine  Completed   HPV Vaccine  Aged Out   Meningitis B Vaccine  Aged Out   Colon Cancer Screening  Discontinued  *Topic was postponed. The date shown is not the original due date.    Advanced directives: (Declined) Advance directive discussed with you today. Even though  you declined this today, please call our office should you change your mind, and we can give you the proper paperwork for you to fill out. Advance Care Planning is important because it:  [x]  Makes sure you receive the medical care that is consistent with your values, goals, and preferences  [x]  It provides guidance to your family and loved ones and reduces their decisional burden about whether or not they are making the right decisions based on your wishes.  Follow the link provided in your after visit summary or read over the paperwork we have mailed to you to help you started getting your Advance Directives in place. If you need assistance in completing these, please reach out to us  so that we can help you!  See attachments for Preventive Care and Fall Prevention Tips.

## 2023-12-17 NOTE — Progress Notes (Signed)
 Because this visit was a virtual/telehealth visit,  certain criteria was not obtained, such a blood pressure, CBG if applicable, and timed get up and go. Any medications not marked as "taking" were not mentioned during the medication reconciliation part of the visit. Any vitals not documented were not able to be obtained due to this being a telehealth visit or patient was unable to self-report a recent blood pressure reading due to a lack of equipment at home via telehealth. Vitals that have been documented are verbally provided by the patient.   This visit was performed by a medical professional under my direct supervision. I was immediately available for consultation/collaboration. I have reviewed and agree with the Annual Wellness Visit documentation.  Subjective:   Laura Reid is a 63 y.o. who presents for a Medicare Wellness preventive visit.  As a reminder, Annual Wellness Visits don't include a physical exam, and some assessments may be limited, especially if this visit is performed virtually. We may recommend an in-person visit if needed.  Visit Complete: Virtual I connected with  Laura Corron on 12/17/23 by a video and audio enabled telemedicine application and verified that I am speaking with the correct person using two identifiers.  Patient Location: Home  Provider Location: Home Office  I discussed the limitations of evaluation and management by telemedicine. The patient expressed understanding and agreed to proceed.  Vital Signs: Because this visit was a virtual/telehealth visit, some criteria may be missing or patient reported. Any vitals not documented were not able to be obtained and vitals that have been documented are patient reported.   Persons Participating in Visit: Patient.  AWV Questionnaire: No: Patient Medicare AWV questionnaire was not completed prior to this visit.  Cardiac Risk Factors include: advanced age (>61men, >11 women);diabetes  mellitus;hypertension;obesity (BMI >30kg/m2)     Objective:     Today's Vitals   12/17/23 1344  BP: 128/82  Weight: 250 lb (113.4 kg)  Height: 5\' 6"  (1.676 m)   Body mass index is 40.35 kg/m.     12/17/2023    1:43 PM 12/18/2022   11:30 AM  Advanced Directives  Does Patient Have a Medical Advance Directive? No No  Would patient like information on creating a medical advance directive? No - Patient declined     Current Medications (verified) Outpatient Encounter Medications as of 12/17/2023  Medication Sig   albuterol  (VENTOLIN  HFA) 108 (90 Base) MCG/ACT inhaler Inhale 2 puffs into the lungs every 6 (six) hours as needed for wheezing or shortness of breath.   amLODipine  (NORVASC ) 10 MG tablet TAKE 1 TABLET BY MOUTH EVERY DAY   Docusate Sodium (DSS) 100 MG CAPS Take 1 capsule by mouth 2 (two) times daily.   gabapentin  (NEURONTIN ) 300 MG capsule Take by mouth.   hydrochlorothiazide  (HYDRODIURIL ) 25 MG tablet TAKE 1 TABLET (25 MG TOTAL) BY MOUTH DAILY.   hydrochlorothiazide  (HYDRODIURIL ) 25 MG tablet TAKE 1 TABLET (25 MG TOTAL) BY MOUTH DAILY.   losartan  (COZAAR ) 100 MG tablet TAKE 1 TABLET BY MOUTH EVERY DAY   metoprolol  succinate (TOPROL -XL) 100 MG 24 hr tablet TAKE 1 TABLET BY MOUTH EVERY DAY WITH OR IMMEDIATELY FOLLOWING A MEAL   Multiple Vitamins-Minerals (THERA-M) TABS Take 1 tablet by mouth daily.   nortriptyline  (PAMELOR ) 25 MG capsule TAKE 1 CAPSULE BY MOUTH AT BEDTIME.   omeprazole  (PRILOSEC) 20 MG capsule TAKE 1 CAPSULE BY MOUTH EVERY DAY   ondansetron (ZOFRAN-ODT) 8 MG disintegrating tablet Take 8 mg by mouth every 8 (eight) hours  as needed.   oxyCODONE-acetaminophen (PERCOCET) 10-325 MG tablet Take 1 tablet by mouth 4 (four) times daily as needed.   PARoxetine  (PAXIL ) 30 MG tablet TAKE 2 TABLETS BY MOUTH DAILY   tiZANidine (ZANAFLEX) 4 MG tablet Take 1/2 to 1 tablet up to three times daily as needed for muscle spasms   vortioxetine  HBr (TRINTELLIX ) 20 MG TABS tablet Take  1 tablet (20 mg total) by mouth daily.   acetaminophen (TYLENOL) 650 MG CR tablet Take by mouth.   methylPREDNISolone (MEDROL DOSEPAK) 4 MG TBPK tablet See admin instructions.   No facility-administered encounter medications on file as of 12/17/2023.    Allergies (verified) Milnacipran, Duloxetine, Clindamycin, and Pregabalin   History: History reviewed. No pertinent past medical history. History reviewed. No pertinent surgical history. History reviewed. No pertinent family history. Social History   Socioeconomic History   Marital status: Married    Spouse name: Not on file   Number of children: Not on file   Years of education: Not on file   Highest education level: Some college, no degree  Occupational History   Not on file  Tobacco Use   Smoking status: Never   Smokeless tobacco: Never  Vaping Use   Vaping status: Never Used  Substance and Sexual Activity   Alcohol use: Not Currently   Drug use: Not Currently   Sexual activity: Yes  Other Topics Concern   Not on file  Social History Narrative   Not on file   Social Drivers of Health   Financial Resource Strain: Low Risk  (12/17/2023)   Overall Financial Resource Strain (CARDIA)    Difficulty of Paying Living Expenses: Not hard at all  Food Insecurity: No Food Insecurity (12/17/2023)   Hunger Vital Sign    Worried About Running Out of Food in the Last Year: Never true    Ran Out of Food in the Last Year: Never true  Transportation Needs: No Transportation Needs (12/17/2023)   PRAPARE - Administrator, Civil Service (Medical): No    Lack of Transportation (Non-Medical): No  Physical Activity: Insufficiently Active (12/17/2023)   Exercise Vital Sign    Days of Exercise per Week: 3 days    Minutes of Exercise per Session: 20 min  Stress: No Stress Concern Present (12/17/2023)   [employer] of Occupational Health - Occupational Stress Questionnaire    Feeling of Stress : Only a little  Social Connections:  Socially Integrated (12/17/2023)   Social Connection and Isolation Panel [NHANES]    Frequency of Communication with Friends and Family: Three times a week    Frequency of Social Gatherings with Friends and Family: Three times a week    Attends Religious Services: More than 4 times per year    Active Member of Clubs or Organizations: Yes    Attends Engineer, structural: More than 4 times per year    Marital Status: Married    Tobacco Counseling Counseling given: Not Answered    Clinical Intake:  Pre-visit preparation completed: Yes  Pain : No/denies pain     BMI - recorded: 40.35 Nutritional Status: BMI > 30  Obese Nutritional Risks: None Diabetes: Yes CBG done?: No Did pt. bring in CBG monitor from home?: No  Lab Results  Component Value Date   HGBA1C 5.5 02/23/2023     How often do you need to have someone help you when you read instructions, pamphlets, or other written materials from your doctor or pharmacy?: 1 - Never  What is the last grade level you completed in school?: Some college  Interpreter Needed?: No  Information entered by :: Juliann Ochoa   Activities of Daily Living     12/17/2023    1:50 PM  In your present state of health, do you have any difficulty performing the following activities:  Hearing? 0  Vision? 0  Difficulty concentrating or making decisions? 0  Walking or climbing stairs? 0  Dressing or bathing? 0  Doing errands, shopping? 0  Preparing Food and eating ? N  Using the Toilet? N  In the past six months, have you accidently leaked urine? N  Do you have problems with loss of bowel control? N  Managing your Medications? N  Managing your Finances? N  Housekeeping or managing your Housekeeping? N    Patient Care Team: Abraham Abo, MD as PCP - General (Family Medicine)  Indicate any recent Medical Services you may have received from other than [facility] providers in the past year (date may be approximate).      Assessment:    This is a routine wellness examination for Laura.  Hearing/Vision screen Hearing Screening - Comments:: Patient has no difficulties hearing Vision Screening - Comments:: Patient wears glasses   Goals Addressed             This Visit's Progress    Patient Stated       Patient would like to live to 100       Depression Screen     12/17/2023    1:51 PM 05/25/2023    1:33 PM 12/18/2022   11:31 AM 05/19/2022    3:06 PM 04/30/2022    3:17 PM 04/07/2022   10:47 AM 01/24/2022   12:27 PM  PHQ 2/9 Scores  PHQ - 2 Score 0 3 0 4 2 3 2   PHQ- 9 Score 4 10  13 11 10 11     Fall Risk     12/17/2023    1:49 PM 05/25/2023    1:34 PM 12/18/2022   11:31 AM 12/15/2022   12:02 PM 01/24/2022   12:32 PM  Fall Risk   Falls in the past year? 1 1 0 0 1  Number falls in past yr: 0 1 0  1  Injury with Fall? 0 0 0  0  Risk for fall due to : History of fall(s) History of fall(s) Medication side effect  History of fall(s)  Follow up Falls prevention discussed;Falls evaluation completed Falls evaluation completed Falls prevention discussed;Education provided;Falls evaluation completed  Falls evaluation completed    MEDICARE RISK AT HOME:  Medicare Risk at Home Any stairs in or around the home?: Yes If so, are there any without handrails?: No Home free of loose throw rugs in walkways, pet beds, electrical cords, etc?: Yes Adequate lighting in your home to reduce risk of falls?: Yes Life alert?: No Use of a cane, walker or w/c?: No Grab bars in the bathroom?: Yes Shower chair or bench in shower?: Yes Elevated toilet seat or a handicapped toilet?: No  TIMED UP AND GO:  Was the test performed?  No  Cognitive Function: 6CIT completed        12/17/2023    1:48 PM 12/18/2022   11:32 AM 01/24/2022   12:35 PM  6CIT Screen  What Year? 0 points 0 points   What month? 0 points 0 points   What time? 0 points 0 points 0 points  Count back from 20 0 points 0 points 0  points  Months in  reverse 0 points 0 points 0 points  Repeat phrase 0 points 2 points 2 points  Total Score 0 points 2 points     Immunizations Immunization History  Administered Date(s) Administered   Influenza, Seasonal, Injecte, Preservative Fre 05/25/2023   Influenza-Unspecified 06/14/2022   Moderna Sars-Covid-2 Vaccination 12/09/2019, 01/04/2020, 08/06/2020   Zoster Recombinant(Shingrix) 07/27/2022, 10/05/2022    Screening Tests Health Maintenance  Topic Date Due   FOOT EXAM  Never done   HIV Screening  Never done   Diabetic kidney evaluation - Urine ACR  Never done   Hepatitis C Screening  Never done   DTaP/Tdap/Td (1 - Tdap) Never done   Pneumococcal Vaccine 73-19 Years old (1 of 2 - PCV) Never done   Cervical Cancer Screening (HPV/Pap Cotest)  Never done   MAMMOGRAM  04/10/2023   HEMOGLOBIN A1C  08/26/2023   Diabetic kidney evaluation - eGFR measurement  11/18/2023   COVID-19 Vaccine (4 - 2024-25 season) 05/23/2024 (Originally 04/12/2023)   OPHTHALMOLOGY EXAM  01/20/2024   INFLUENZA VACCINE  03/11/2024   Medicare Annual Wellness (AWV)  12/16/2024   Zoster Vaccines- Shingrix  Completed   HPV VACCINES  Aged Out   Meningococcal B Vaccine  Aged Out   Colonoscopy  Discontinued    Health Maintenance  Health Maintenance Due  Topic Date Due   FOOT EXAM  Never done   HIV Screening  Never done   Diabetic kidney evaluation - Urine ACR  Never done   Hepatitis C Screening  Never done   DTaP/Tdap/Td (1 - Tdap) Never done   Pneumococcal Vaccine 40-63 Years old (1 of 2 - PCV) Never done   Cervical Cancer Screening (HPV/Pap Cotest)  Never done   MAMMOGRAM  04/10/2023   HEMOGLOBIN A1C  08/26/2023   Diabetic kidney evaluation - eGFR measurement  11/18/2023   Health Maintenance Items Addressed: Diabetic Foot Exam scheduled, A1C, UACR (Urine Albumin:Creatinine Ratio)  Additional Screening:  Vision Screening: Recommended annual ophthalmology exams for early detection of glaucoma and other  disorders of the eye.  Dental Screening: Recommended annual dental exams for proper oral hygiene  Community Resource Referral / Chronic Care Management: CRR required this visit?  No   CCM required this visit?  No   Plan:    I have personally reviewed and noted the following in the patient's chart:   Medical and social history Use of alcohol, tobacco or illicit drugs  Current medications and supplements including opioid prescriptions. Patient is not currently taking opioid prescriptions. Functional ability and status Nutritional status Physical activity Advanced directives List of other physicians Hospitalizations, surgeries, and ER visits in previous 12 months Vitals Screenings to include cognitive, depression, and falls Referrals and appointments  In addition, I have reviewed and discussed with patient certain preventive protocols, quality metrics, and best practice recommendations. A written personalized care plan for preventive services as well as general preventive health recommendations were provided to patient.   Freeda Jerry, [place]   12/17/2023   After Visit Summary: (MyChart) Due to this being a telephonic visit, the after visit summary with patients personalized plan was offered to patient via MyChart   Notes: Nothing significant to report at this time.

## 2024-01-07 ENCOUNTER — Other Ambulatory Visit: Payer: Self-pay | Admitting: Family Medicine

## 2024-01-07 MED ORDER — SUCRALFATE 1 G PO TABS: 1.0000 g | ORAL_TABLET | Freq: Three times a day (TID) | ORAL | 1 refills | Status: AC

## 2024-02-17 ENCOUNTER — Other Ambulatory Visit: Payer: Self-pay | Admitting: Family Medicine

## 2024-02-17 DIAGNOSIS — I10 Essential (primary) hypertension: Secondary | ICD-10-CM

## 2024-03-02 ENCOUNTER — Other Ambulatory Visit: Payer: Self-pay | Admitting: Family Medicine

## 2024-03-02 ENCOUNTER — Encounter: Payer: Self-pay | Admitting: Family Medicine

## 2024-03-23 DIAGNOSIS — M797 Fibromyalgia: Secondary | ICD-10-CM | POA: Diagnosis not present

## 2024-03-23 DIAGNOSIS — M25511 Pain in right shoulder: Secondary | ICD-10-CM | POA: Diagnosis not present

## 2024-03-23 DIAGNOSIS — M150 Primary generalized (osteo)arthritis: Secondary | ICD-10-CM | POA: Diagnosis not present

## 2024-03-23 DIAGNOSIS — G629 Polyneuropathy, unspecified: Secondary | ICD-10-CM | POA: Diagnosis not present

## 2024-03-23 DIAGNOSIS — G43001 Migraine without aura, not intractable, with status migrainosus: Secondary | ICD-10-CM | POA: Diagnosis not present

## 2024-03-23 DIAGNOSIS — M519 Unspecified thoracic, thoracolumbar and lumbosacral intervertebral disc disorder: Secondary | ICD-10-CM | POA: Diagnosis not present

## 2024-03-23 DIAGNOSIS — M542 Cervicalgia: Secondary | ICD-10-CM | POA: Diagnosis not present

## 2024-03-23 DIAGNOSIS — G894 Chronic pain syndrome: Secondary | ICD-10-CM | POA: Diagnosis not present

## 2024-03-30 ENCOUNTER — Ambulatory Visit (INDEPENDENT_AMBULATORY_CARE_PROVIDER_SITE_OTHER): Admitting: Family Medicine

## 2024-03-30 ENCOUNTER — Encounter: Payer: Self-pay | Admitting: Family Medicine

## 2024-03-30 VITALS — BP 161/92 | HR 63 | Ht 66.0 in | Wt 256.6 lb

## 2024-03-30 DIAGNOSIS — R5383 Other fatigue: Secondary | ICD-10-CM

## 2024-03-30 DIAGNOSIS — N2889 Other specified disorders of kidney and ureter: Secondary | ICD-10-CM

## 2024-03-30 DIAGNOSIS — E66813 Obesity, class 3: Secondary | ICD-10-CM

## 2024-03-30 DIAGNOSIS — N281 Cyst of kidney, acquired: Secondary | ICD-10-CM

## 2024-03-30 DIAGNOSIS — Z1211 Encounter for screening for malignant neoplasm of colon: Secondary | ICD-10-CM

## 2024-03-30 DIAGNOSIS — B3731 Acute candidiasis of vulva and vagina: Secondary | ICD-10-CM | POA: Diagnosis not present

## 2024-03-30 DIAGNOSIS — Z6841 Body Mass Index (BMI) 40.0 and over, adult: Secondary | ICD-10-CM

## 2024-03-30 LAB — POCT GLYCOSYLATED HEMOGLOBIN (HGB A1C): HbA1c, POC (controlled diabetic range): 5.6 % (ref 0.0–7.0)

## 2024-03-30 MED ORDER — FLUCONAZOLE 150 MG PO TABS: 150.0000 mg | ORAL_TABLET | Freq: Once | ORAL | 0 refills | Status: AC

## 2024-03-30 NOTE — Progress Notes (Unsigned)
 Established Patient Office Visit  Subjective    Patient ID: Laura Reid, female    DOB: Jan 28, 1961  Age: 63 y.o. MRN: [id number]  CC:  Chief Complaint  Patient presents with   Fatigue    Pt reports in the last 2 weeks she has had extreme fatigue, no energy, drowsiness and dizziness. She reports it feels as bad as she felt when she had mono. Also reports throat will hurt if she does not keep it wet.   Pt reports she also believes she has a vaginal yeast infection   Pt also reports willing to do cologuard     HPI Laura Reid presents for complaint of extreme fatigue as well as a vaginal yeast infection.   Outpatient Encounter Medications as of 03/30/2024  Medication Sig   albuterol  (VENTOLIN  HFA) 108 (90 Base) MCG/ACT inhaler Inhale 2 puffs into the lungs every 6 (six) hours as needed for wheezing or shortness of breath.   amLODipine  (NORVASC ) 10 MG tablet TAKE 1 TABLET BY MOUTH EVERY DAY   Docusate Sodium (DSS) 100 MG CAPS Take 1 capsule by mouth 2 (two) times daily.   [EXPIRED] fluconazole  (DIFLUCAN ) 150 MG tablet Take 1 tablet (150 mg total) by mouth once for 1 dose.   gabapentin  (NEURONTIN ) 300 MG capsule Take by mouth.   hydrochlorothiazide  (HYDRODIURIL ) 25 MG tablet TAKE 1 TABLET (25 MG TOTAL) BY MOUTH DAILY.   losartan  (COZAAR ) 100 MG tablet TAKE 1 TABLET BY MOUTH EVERY DAY   metoprolol  succinate (TOPROL -XL) 100 MG 24 hr tablet TAKE 1 TABLET BY MOUTH EVERY DAY WITH OR IMMEDIATELY FOLLOWING A MEAL   Multiple Vitamins-Minerals (THERA-M) TABS Take 1 tablet by mouth daily.   nortriptyline  (PAMELOR ) 25 MG capsule TAKE 1 CAPSULE BY MOUTH AT BEDTIME.   omeprazole  (PRILOSEC) 20 MG capsule TAKE 1 CAPSULE BY MOUTH EVERY DAY   ondansetron (ZOFRAN-ODT) 8 MG disintegrating tablet Take 8 mg by mouth every 8 (eight) hours as needed.   oxyCODONE-acetaminophen (PERCOCET) 10-325 MG tablet Take 1 tablet by mouth 4 (four) times daily as needed.   PARoxetine  (PAXIL ) 30 MG tablet TAKE 2  TABLETS BY MOUTH DAILY   tiZANidine (ZANAFLEX) 4 MG tablet Take 1/2 to 1 tablet up to three times daily as needed for muscle spasms   hydrochlorothiazide  (HYDRODIURIL ) 25 MG tablet TAKE 1 TABLET (25 MG TOTAL) BY MOUTH DAILY.   hydrochlorothiazide  (HYDRODIURIL ) 25 MG tablet TAKE 1 TABLET (25 MG TOTAL) BY MOUTH DAILY.   methylPREDNISolone (MEDROL DOSEPAK) 4 MG TBPK tablet See admin instructions.   sucralfate  (CARAFATE ) 1 g tablet Take 1 tablet (1 g total) by mouth 4 (four) times daily -  with meals and at bedtime. (Patient not taking: Reported on 03/30/2024)   vortioxetine  HBr (TRINTELLIX ) 20 MG TABS tablet Take 1 tablet (20 mg total) by mouth daily. (Patient not taking: Reported on 03/30/2024)   No facility-administered encounter medications on file as of 03/30/2024.    History reviewed. No pertinent past medical history.  History reviewed. No pertinent surgical history.  History reviewed. No pertinent family history.  Social History   Socioeconomic History   Marital status: Married    Spouse name: Not on file   Number of children: Not on file   Years of education: Not on file   Highest education level: Some college, no degree  Occupational History   Not on file  Tobacco Use   Smoking status: Never   Smokeless tobacco: Never  Vaping Use   Vaping status: Never  Used  Substance and Sexual Activity   Alcohol use: Not Currently   Drug use: Not Currently   Sexual activity: Yes  Other Topics Concern   Not on file  Social History Narrative   Not on file   Social Drivers of Health   Financial Resource Strain: Low Risk  (03/28/2024)   Overall Financial Resource Strain (CARDIA)    Difficulty of Paying Living Expenses: Not very hard  Food Insecurity: No Food Insecurity (03/28/2024)   Hunger Vital Sign    Worried About Running Out of Food in the Last Year: Never true    Ran Out of Food in the Last Year: Never true  Transportation Needs: No Transportation Needs (03/28/2024)   PRAPARE -  Administrator, Civil Service (Medical): No    Lack of Transportation (Non-Medical): No  Physical Activity: Insufficiently Active (03/28/2024)   Exercise Vital Sign    Days of Exercise per Week: 1 day    Minutes of Exercise per Session: 60 min  Stress: No Stress Concern Present (03/28/2024)   [employer] of Occupational Health - Occupational Stress Questionnaire    Feeling of Stress: Only a little  Social Connections: Socially Integrated (03/28/2024)   Social Connection and Isolation Panel    Frequency of Communication with Friends and Family: More than three times a week    Frequency of Social Gatherings with Friends and Family: Three times a week    Attends Religious Services: More than 4 times per year    Active Member of Clubs or Organizations: Yes    Attends Banker Meetings: More than 4 times per year    Marital Status: Married  Catering manager Violence: Not At Risk (12/17/2023)   Humiliation, Afraid, Rape, and Kick questionnaire    Fear of Current or Ex-Partner: No    Emotionally Abused: No    Physically Abused: No    Sexually Abused: No    Review of Systems  Constitutional:  Positive for malaise/fatigue.  All other systems reviewed and are negative.       Objective    BP (!) 161/92   Pulse 63   Ht 5' 6 (1.676 m)   Wt 256 lb 9.6 oz (116.4 kg)   SpO2 93%   BMI 41.42 kg/m   Physical Exam Vitals and nursing note reviewed.  Constitutional:      General: She is not in acute distress.    Appearance: She is obese.  Cardiovascular:     Rate and Rhythm: Normal rate and regular rhythm.  Pulmonary:     Effort: Pulmonary effort is normal.     Breath sounds: Normal breath sounds.  Abdominal:     Palpations: Abdomen is soft.     Tenderness: There is no abdominal tenderness.  Neurological:     General: No focal deficit present.     Mental Status: She is alert and oriented to person, place, and time.         Assessment & Plan:    Other fatigue -     POCT glycosylated hemoglobin (Hb A1C) -     CBC with Differential/Platelet -     Comprehensive metabolic panel with GFR -     VITAMIN D  25 Hydroxy (Vit-D Deficiency, Fractures) -     TSH  Yeast vaginitis  Class 3 severe obesity due to excess calories with serious comorbidity and body mass index (BMI) of 40.0 to 44.9 in adult  Other specified disorders of kidney and ureter -  CT RENAL ABDOMEN W WO CONTRAST; Future  Screening for colon cancer -     Cologuard  Other orders -     Fluconazole ; Take 1 tablet (150 mg total) by mouth once for 1 dose.  Dispense: 1 tablet; Refill: 0     No follow-ups on file.   Tanda Raguel SQUIBB, MD

## 2024-03-31 ENCOUNTER — Encounter: Payer: Self-pay | Admitting: Family Medicine

## 2024-03-31 LAB — CBC WITH DIFFERENTIAL/PLATELET
Basophils Absolute: 0.1 x10E3/uL (ref 0.0–0.2)
Basos: 1 %
EOS (ABSOLUTE): 0.4 x10E3/uL (ref 0.0–0.4)
Eos: 5 %
Hematocrit: 38.6 % (ref 34.0–46.6)
Hemoglobin: 12.4 g/dL (ref 11.1–15.9)
Immature Grans (Abs): 0 x10E3/uL (ref 0.0–0.1)
Immature Granulocytes: 0 %
Lymphocytes Absolute: 2.1 x10E3/uL (ref 0.7–3.1)
Lymphs: 28 %
MCH: 28.7 pg (ref 26.6–33.0)
MCHC: 32.1 g/dL (ref 31.5–35.7)
MCV: 89 fL (ref 79–97)
Monocytes Absolute: 0.6 x10E3/uL (ref 0.1–0.9)
Monocytes: 7 %
Neutrophils Absolute: 4.6 x10E3/uL (ref 1.4–7.0)
Neutrophils: 59 %
Platelets: 254 x10E3/uL (ref 150–450)
RBC: 4.32 x10E6/uL (ref 3.77–5.28)
RDW: 13.4 % (ref 11.7–15.4)
WBC: 7.7 x10E3/uL (ref 3.4–10.8)

## 2024-03-31 LAB — COMPREHENSIVE METABOLIC PANEL WITH GFR
ALT: 19 IU/L (ref 0–32)
AST: 26 IU/L (ref 0–40)
Albumin: 4.2 g/dL (ref 3.9–4.9)
Alkaline Phosphatase: 116 IU/L (ref 44–121)
BUN/Creatinine Ratio: 28 (ref 12–28)
BUN: 26 mg/dL (ref 8–27)
Bilirubin Total: 0.3 mg/dL (ref 0.0–1.2)
CO2: 22 mmol/L (ref 20–29)
Calcium: 9.2 mg/dL (ref 8.7–10.3)
Chloride: 103 mmol/L (ref 96–106)
Creatinine, Ser: 0.94 mg/dL (ref 0.57–1.00)
Globulin, Total: 2.9 g/dL (ref 1.5–4.5)
Glucose: 75 mg/dL (ref 70–99)
Potassium: 4.4 mmol/L (ref 3.5–5.2)
Sodium: 141 mmol/L (ref 134–144)
Total Protein: 7.1 g/dL (ref 6.0–8.5)
eGFR: 68 mL/min/1.73 (ref >59)

## 2024-03-31 LAB — TSH: TSH: 5.34 u[IU]/mL — ABNORMAL HIGH (ref 0.450–4.500)

## 2024-03-31 LAB — VITAMIN D 25 HYDROXY (VIT D DEFICIENCY, FRACTURES): Vit D, 25-Hydroxy: 34.7 ng/mL (ref 30.0–100.0)

## 2024-04-01 ENCOUNTER — Ambulatory Visit: Payer: Self-pay | Admitting: Family Medicine

## 2024-04-06 ENCOUNTER — Telehealth: Payer: Self-pay | Admitting: Family Medicine

## 2024-04-06 NOTE — Telephone Encounter (Signed)
 A document form from [facility] has been faxed: verbal order, to be filled out by provider. Send document back via Fax within 7-days. Document is located in providers tray at front office.          Fax number: [phone]

## 2024-04-10 ENCOUNTER — Encounter: Payer: Self-pay | Admitting: Family Medicine

## 2024-04-13 LAB — T4, FREE: Free T4: 0.9 ng/dL (ref 0.82–1.77)

## 2024-04-13 LAB — SPECIMEN STATUS REPORT

## 2024-04-14 ENCOUNTER — Encounter: Payer: Self-pay | Admitting: Family Medicine

## 2024-04-21 ENCOUNTER — Other Ambulatory Visit: Payer: Self-pay | Admitting: Family Medicine

## 2024-04-21 MED ORDER — LEVOTHYROXINE SODIUM 25 MCG PO TABS: 25.0000 ug | ORAL_TABLET | Freq: Every day | ORAL | 0 refills | Status: DC

## 2024-04-21 NOTE — Progress Notes (Signed)
 Spoke to pt. She expressed understanding.

## 2024-04-24 ENCOUNTER — Encounter: Payer: Self-pay | Admitting: Family Medicine

## 2024-04-25 ENCOUNTER — Ambulatory Visit (INDEPENDENT_AMBULATORY_CARE_PROVIDER_SITE_OTHER): Admitting: Family Medicine

## 2024-04-25 ENCOUNTER — Ambulatory Visit: Payer: Self-pay | Admitting: Family Medicine

## 2024-04-25 ENCOUNTER — Ambulatory Visit: Payer: Self-pay | Admitting: Nurse Practitioner

## 2024-04-25 VITALS — BP 144/84 | HR 80 | Temp 98.2°F | Resp 16 | Wt 259.0 lb

## 2024-04-25 DIAGNOSIS — Z20818 Contact with and (suspected) exposure to other bacterial communicable diseases: Secondary | ICD-10-CM

## 2024-04-25 DIAGNOSIS — Z1152 Encounter for screening for COVID-19: Secondary | ICD-10-CM

## 2024-04-25 DIAGNOSIS — B349 Viral infection, unspecified: Secondary | ICD-10-CM | POA: Diagnosis not present

## 2024-04-25 DIAGNOSIS — Z112 Encounter for screening for other bacterial diseases: Secondary | ICD-10-CM

## 2024-04-25 LAB — POC SOFIA 2 FLU + SARS ANTIGEN FIA
Influenza A, POC: NEGATIVE
Influenza B, POC: NEGATIVE
SARS Coronavirus 2 Ag: NEGATIVE

## 2024-04-25 LAB — POCT RAPID STREP A (OFFICE): Rapid Strep A Screen: NEGATIVE

## 2024-04-25 NOTE — Telephone Encounter (Signed)
 Pt scheduled for today.

## 2024-04-25 NOTE — Progress Notes (Unsigned)
 Patient c/o possible  strep throat, ear hurt, throat pain,head, and chest pain 7/10

## 2024-04-27 ENCOUNTER — Encounter: Payer: Self-pay | Admitting: Family Medicine

## 2024-04-27 ENCOUNTER — Other Ambulatory Visit: Payer: Self-pay | Admitting: Family Medicine

## 2024-04-27 ENCOUNTER — Telehealth: Payer: Self-pay

## 2024-04-27 DIAGNOSIS — Z1231 Encounter for screening mammogram for malignant neoplasm of breast: Secondary | ICD-10-CM

## 2024-04-27 NOTE — Telephone Encounter (Signed)
 error

## 2024-04-27 NOTE — Progress Notes (Signed)
 Established Patient Office Visit  Subjective    Patient ID: Laura Reid, female    DOB: 1961/04/23  Age: 63 y.o. MRN: [id number]  CC: No chief complaint on file.   HPI [place] presents for complaint of exposure to strep with symptoms of sore throat, general malaise, muscle aches, etc.   Outpatient Encounter Medications as of 04/25/2024  Medication Sig   albuterol  (VENTOLIN  HFA) 108 (90 Base) MCG/ACT inhaler Inhale 2 puffs into the lungs every 6 (six) hours as needed for wheezing or shortness of breath.   amLODipine  (NORVASC ) 10 MG tablet TAKE 1 TABLET BY MOUTH EVERY DAY   Docusate Sodium (DSS) 100 MG CAPS Take 1 capsule by mouth 2 (two) times daily.   gabapentin  (NEURONTIN ) 300 MG capsule Take by mouth.   hydrochlorothiazide  (HYDRODIURIL ) 25 MG tablet TAKE 1 TABLET (25 MG TOTAL) BY MOUTH DAILY.   hydrochlorothiazide  (HYDRODIURIL ) 25 MG tablet TAKE 1 TABLET (25 MG TOTAL) BY MOUTH DAILY.   hydrochlorothiazide  (HYDRODIURIL ) 25 MG tablet TAKE 1 TABLET (25 MG TOTAL) BY MOUTH DAILY.   levothyroxine  (SYNTHROID ) 25 MCG tablet Take 1 tablet (25 mcg total) by mouth daily.   losartan  (COZAAR ) 100 MG tablet TAKE 1 TABLET BY MOUTH EVERY DAY   methylPREDNISolone (MEDROL DOSEPAK) 4 MG TBPK tablet See admin instructions.   metoprolol  succinate (TOPROL -XL) 100 MG 24 hr tablet TAKE 1 TABLET BY MOUTH EVERY DAY WITH OR IMMEDIATELY FOLLOWING A MEAL   Multiple Vitamins-Minerals (THERA-M) TABS Take 1 tablet by mouth daily.   nortriptyline  (PAMELOR ) 25 MG capsule TAKE 1 CAPSULE BY MOUTH AT BEDTIME.   omeprazole  (PRILOSEC) 20 MG capsule TAKE 1 CAPSULE BY MOUTH EVERY DAY   ondansetron (ZOFRAN-ODT) 8 MG disintegrating tablet Take 8 mg by mouth every 8 (eight) hours as needed.   oxyCODONE-acetaminophen (PERCOCET) 10-325 MG tablet Take 1 tablet by mouth 4 (four) times daily as needed.   PARoxetine  (PAXIL ) 30 MG tablet TAKE 2 TABLETS BY MOUTH DAILY   sucralfate  (CARAFATE ) 1 g tablet Take 1 tablet (1 g  total) by mouth 4 (four) times daily -  with meals and at bedtime. (Patient not taking: Reported on 03/30/2024)   tiZANidine (ZANAFLEX) 4 MG tablet Take 1/2 to 1 tablet up to three times daily as needed for muscle spasms   vortioxetine  HBr (TRINTELLIX ) 20 MG TABS tablet Take 1 tablet (20 mg total) by mouth daily. (Patient not taking: Reported on 03/30/2024)   No facility-administered encounter medications on file as of 04/25/2024.    No past medical history on file.  No past surgical history on file.  No family history on file.  Social History   Socioeconomic History   Marital status: Married    Spouse name: Not on file   Number of children: Not on file   Years of education: Not on file   Highest education level: Some college, no degree  Occupational History   Not on file  Tobacco Use   Smoking status: Never   Smokeless tobacco: Never  Vaping Use   Vaping status: Never Used  Substance and Sexual Activity   Alcohol use: Not Currently   Drug use: Not Currently   Sexual activity: Yes  Other Topics Concern   Not on file  Social History Narrative   Not on file   Social Drivers of Health   Financial Resource Strain: Low Risk  (03/28/2024)   Overall Financial Resource Strain (CARDIA)    Difficulty of Paying Living Expenses: Not very hard  Food  Insecurity: No Food Insecurity (03/28/2024)   Hunger Vital Sign    Worried About Running Out of Food in the Last Year: Never true    Ran Out of Food in the Last Year: Never true  Transportation Needs: No Transportation Needs (03/28/2024)   PRAPARE - Administrator, Civil Service (Medical): No    Lack of Transportation (Non-Medical): No  Physical Activity: Insufficiently Active (03/28/2024)   Exercise Vital Sign    Days of Exercise per Week: 1 day    Minutes of Exercise per Session: 60 min  Stress: No Stress Concern Present (03/28/2024)   [employer] of Occupational Health - Occupational Stress Questionnaire    Feeling  of Stress: Only a little  Social Connections: Socially Integrated (03/28/2024)   Social Connection and Isolation Panel    Frequency of Communication with Friends and Family: More than three times a week    Frequency of Social Gatherings with Friends and Family: Three times a week    Attends Religious Services: More than 4 times per year    Active Member of Clubs or Organizations: Yes    Attends Banker Meetings: More than 4 times per year    Marital Status: Married  Catering manager Violence: Not At Risk (12/17/2023)   Humiliation, Afraid, Rape, and Kick questionnaire    Fear of Current or Ex-Partner: No    Emotionally Abused: No    Physically Abused: No    Sexually Abused: No    Review of Systems  All other systems reviewed and are negative.       Objective    BP (!) 144/84   Pulse 80   Temp 98.2 F (36.8 C) (Oral)   Resp 16   Wt 259 lb (117.5 kg)   SpO2 97%   BMI 41.80 kg/m   Physical Exam Vitals and nursing note reviewed.  Constitutional:      General: She is not in acute distress.    Appearance: She is obese.  Cardiovascular:     Rate and Rhythm: Normal rate and regular rhythm.  Pulmonary:     Effort: Pulmonary effort is normal.     Breath sounds: Normal breath sounds.  Abdominal:     Palpations: Abdomen is soft.     Tenderness: There is no abdominal tenderness.  Neurological:     General: No focal deficit present.     Mental Status: She is alert and oriented to person, place, and time.         Assessment & Plan:   Strep throat exposure -     POCT rapid strep A  Encounter for screening for COVID-19 -     POC SOFIA 2 FLU + SARS ANTIGEN FIA  Viral syndrome     Return if symptoms worsen or fail to improve.   Laura Raguel SQUIBB, MD

## 2024-05-04 NOTE — Telephone Encounter (Signed)
 Order sent to [facility] per request

## 2024-05-08 ENCOUNTER — Encounter: Payer: Self-pay | Admitting: Family Medicine

## 2024-05-09 ENCOUNTER — Ambulatory Visit: Payer: Self-pay

## 2024-05-09 ENCOUNTER — Other Ambulatory Visit: Payer: Self-pay | Admitting: Family Medicine

## 2024-05-09 DIAGNOSIS — N10 Acute tubulo-interstitial nephritis: Secondary | ICD-10-CM | POA: Diagnosis not present

## 2024-05-09 DIAGNOSIS — Z833 Family history of diabetes mellitus: Secondary | ICD-10-CM | POA: Diagnosis not present

## 2024-05-09 DIAGNOSIS — M797 Fibromyalgia: Secondary | ICD-10-CM | POA: Diagnosis not present

## 2024-05-09 DIAGNOSIS — R109 Unspecified abdominal pain: Secondary | ICD-10-CM | POA: Diagnosis not present

## 2024-05-09 DIAGNOSIS — Z7902 Long term (current) use of antithrombotics/antiplatelets: Secondary | ICD-10-CM | POA: Diagnosis not present

## 2024-05-09 DIAGNOSIS — N200 Calculus of kidney: Secondary | ICD-10-CM | POA: Diagnosis not present

## 2024-05-09 DIAGNOSIS — I10 Essential (primary) hypertension: Secondary | ICD-10-CM | POA: Diagnosis not present

## 2024-05-09 DIAGNOSIS — N289 Disorder of kidney and ureter, unspecified: Secondary | ICD-10-CM | POA: Diagnosis not present

## 2024-05-09 DIAGNOSIS — E119 Type 2 diabetes mellitus without complications: Secondary | ICD-10-CM | POA: Diagnosis not present

## 2024-05-09 DIAGNOSIS — E785 Hyperlipidemia, unspecified: Secondary | ICD-10-CM | POA: Diagnosis not present

## 2024-05-09 DIAGNOSIS — Z79899 Other long term (current) drug therapy: Secondary | ICD-10-CM | POA: Diagnosis not present

## 2024-05-09 DIAGNOSIS — M545 Low back pain, unspecified: Secondary | ICD-10-CM | POA: Diagnosis not present

## 2024-05-09 DIAGNOSIS — R300 Dysuria: Secondary | ICD-10-CM | POA: Diagnosis not present

## 2024-05-09 DIAGNOSIS — R350 Frequency of micturition: Secondary | ICD-10-CM | POA: Diagnosis not present

## 2024-05-09 DIAGNOSIS — N281 Cyst of kidney, acquired: Secondary | ICD-10-CM | POA: Diagnosis not present

## 2024-05-09 DIAGNOSIS — N132 Hydronephrosis with renal and ureteral calculous obstruction: Secondary | ICD-10-CM | POA: Diagnosis not present

## 2024-05-09 DIAGNOSIS — R3915 Urgency of urination: Secondary | ICD-10-CM | POA: Diagnosis not present

## 2024-05-09 MED ORDER — NITROFURANTOIN MONOHYD MACRO 100 MG PO CAPS: 100.0000 mg | ORAL_CAPSULE | Freq: Two times a day (BID) | ORAL | 0 refills | Status: AC

## 2024-05-09 NOTE — Telephone Encounter (Signed)
 FYI Only or Action Required?: FYI only for provider.  Patient was last seen in primary care on 04/25/2024 by Tanda Bleacher, MD.  Called Nurse Triage reporting Flank Pain.  Symptoms began yesterday.  Interventions attempted: OTC medications: AZO.  Symptoms are: gradually worsening.  Triage Disposition: See HCP Within 4 Hours (Or PCP Triage)  Patient/caregiver understands and will follow disposition?: Yes No appts avail, patent agreeable to visit an urgent care today  Copied from CRM #[id number]. Topic: Clinical - Red Word Triage >> May 09, 2024 11:40 AM Laura Reid wrote: Red Word that prompted transfer to Nurse Triage: Patient believes she has a UTI or Kidney infection. Pain in kidney area. frequent urination, burning, Reason for Disposition  Pain or burning with passing urine (urination)  Answer Assessment - Initial Assessment Questions 1. LOCATION: Where does it hurt? (e.g., left, right)     Right Flank 2. ONSET: When did the pain start?     Started yesterday  3. SEVERITY: How bad is the pain? (e.g., Scale 1-10; mild, moderate, or severe)     Mild to moderate  4. PATTERN: Does the pain come and go, or is it constant?      Constant  5. CAUSE: What do you think is causing the pain?     Concern for UTI or kidney infection  6. OTHER SYMPTOMS:  Do you have any other symptoms? (e.g., fever, abdomen pain, vomiting, leg weakness, burning with urination, blood in urine)     Cloudy urine, frequent urination and burning, low abd pressure   7. PREGNANCY:  Is there any chance you are pregnant? When was your last menstrual period?     No  Protocols used: Flank Pain-A-AH

## 2024-05-11 ENCOUNTER — Other Ambulatory Visit: Payer: Self-pay | Admitting: Pharmacist

## 2024-05-11 DIAGNOSIS — I10 Essential (primary) hypertension: Secondary | ICD-10-CM

## 2024-05-11 MED ORDER — LOSARTAN POTASSIUM 100 MG PO TABS: 100.0000 mg | ORAL_TABLET | Freq: Every day | ORAL | 0 refills | Status: AC

## 2024-05-11 NOTE — Progress Notes (Signed)
 Pharmacy Quality Measure Review  This patient is appearing on a report for being at risk of failing the adherence measure for hypertension (ACEi/ARB) medications this calendar year.   Medication: losartan  Last fill date: 03/02/24 for 90 day supply  New rxn needed. Sent to patient's pharmacy. Reminder set for next fill.   Herlene Fleeta Morris, PharmD, JAQUELINE, CPP Clinical Pharmacist [facility] [phone]

## 2024-05-16 DIAGNOSIS — Z1211 Encounter for screening for malignant neoplasm of colon: Secondary | ICD-10-CM | POA: Diagnosis not present

## 2024-05-18 ENCOUNTER — Other Ambulatory Visit: Payer: Self-pay | Admitting: Family Medicine

## 2024-05-18 DIAGNOSIS — I10 Essential (primary) hypertension: Secondary | ICD-10-CM

## 2024-05-19 ENCOUNTER — Encounter: Payer: Self-pay | Admitting: Family Medicine

## 2024-05-19 IMAGING — MR MR CERVICAL SPINE W/O CM
6 series · 35 of 48 positions shown · non-contrast
Comparison: Report from radiographs of the cervical spine
01/30/2009 (images currently unavailable)

CLINICAL DATA: Provided history: Loss of balance, neuropathy.

EXAM:
MRI CERVICAL SPINE WITHOUT CONTRAST
TECHNIQUE: Multiplanar, multisequence MR imaging of the cervical spine was
performed. No intravenous contrast was administered.

[Series 6: T1 · sagittal · 3.0mm · 0.69mm/px · 6 of 15 slices shown (1 of 2)]
[im 1/15]
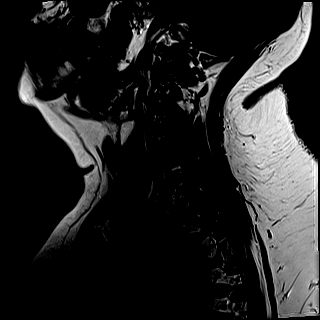
[im 3/15]
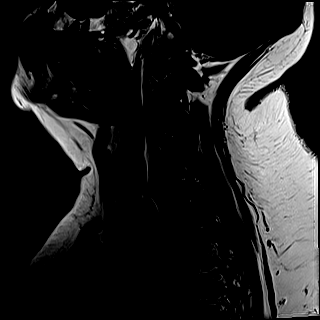
[im 6/15]
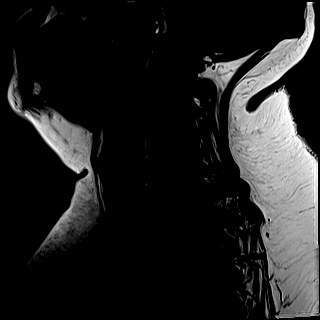
[im 9/15]
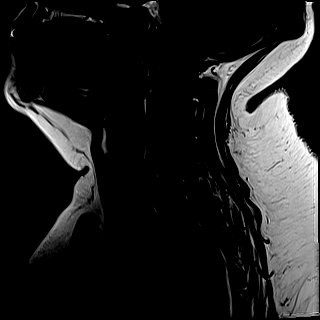
[im 12/15]
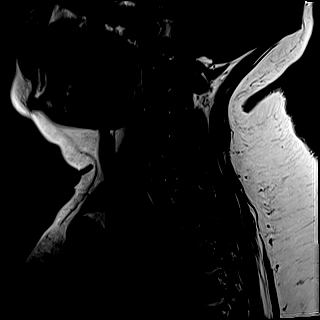
[im 15/15]
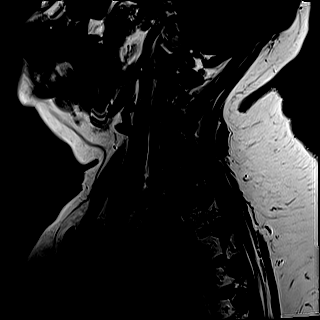

[Series 7: T2 · sagittal · 3.0mm · 0.69mm/px · 6 of 15 slices shown (1 of 2)]
[im 1/15]
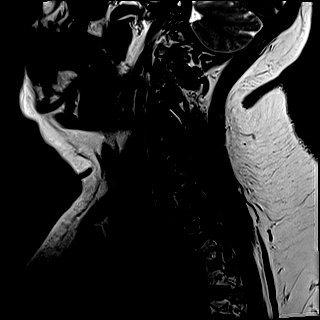
[im 3/15]
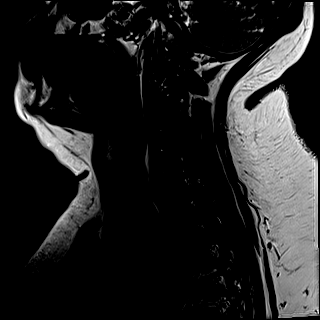
[im 6/15]
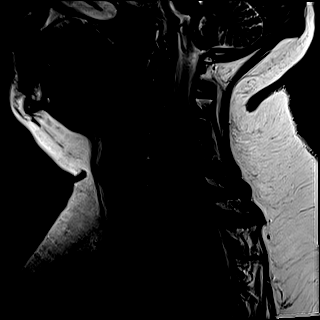
[im 9/15]
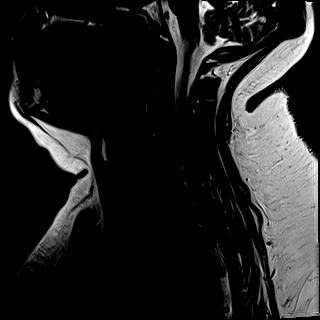
[im 12/15]
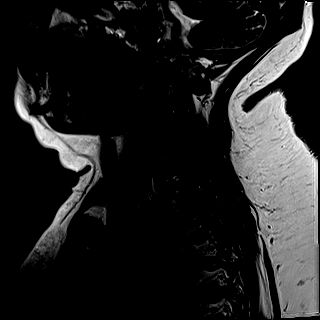
[im 15/15]
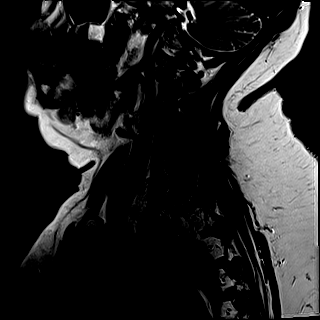

[Series 8: STIR · sagittal · 3.0mm · 0.86mm/px · 6 of 15 slices shown]
[im 1/15]
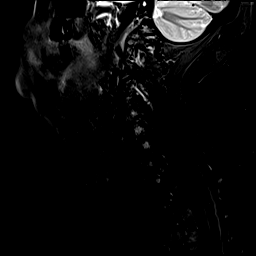
[im 3/15]
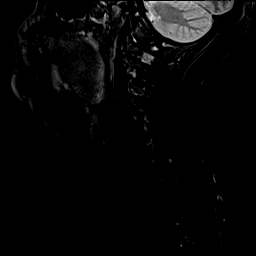
[im 6/15]
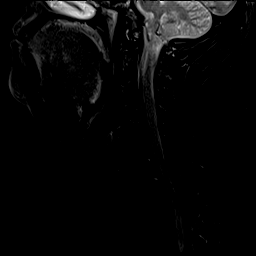
[im 9/15]
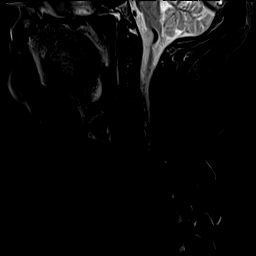
[im 12/15]
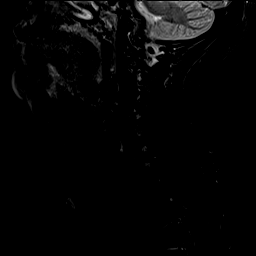
[im 15/15]
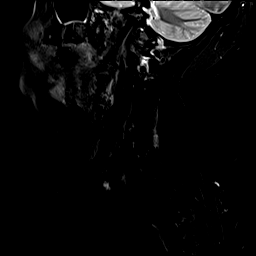

[Series 9: T2 · axial · 3.0mm · 0.70mm/px · z∈[-211,-123]mm · 8 of 27 slices shown (2 of 2)]
[im 1/27]
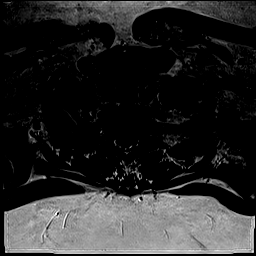
[im 3/27]
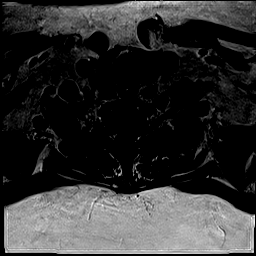
[im 9/27]
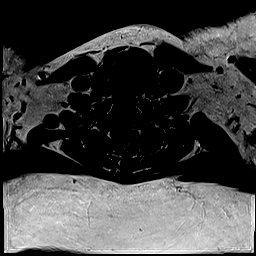
[im 12/27]
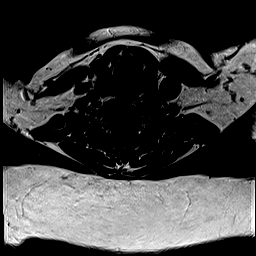
[im 15/27]
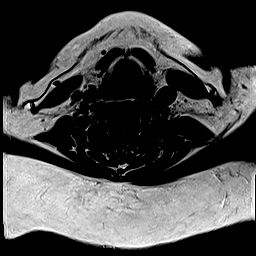
[im 18/27]
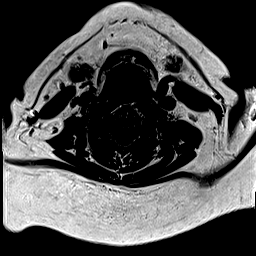
[im 24/27]
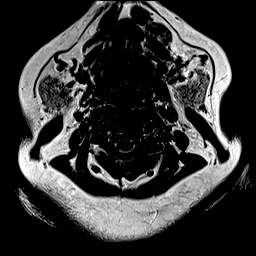
[im 27/27]
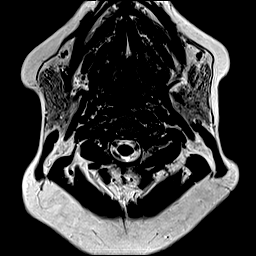

[Series 10: GRE · axial · 3.0mm · 0.35mm/px · 1 of 27 slices shown]
[im 1/27]
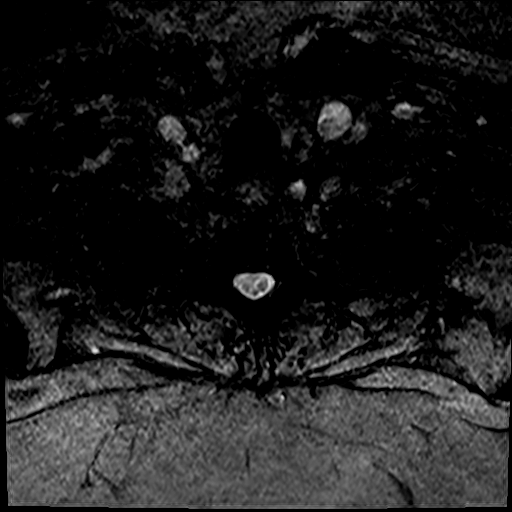

[Series 11: T1 · axial · 3.0mm · 0.35mm/px · z∈[-211,-123]mm · 8 of 27 slices shown (2 of 2)]
[im 1/27]
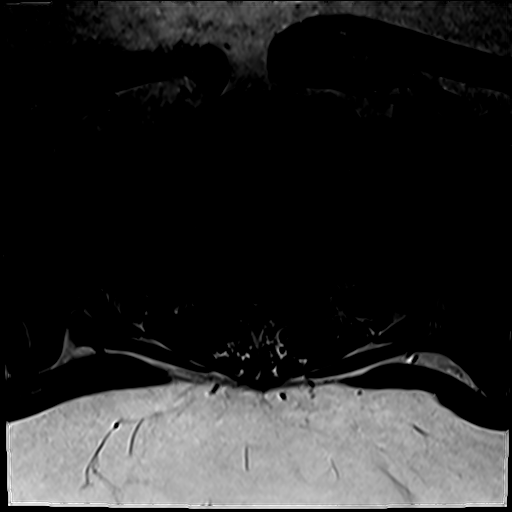
[im 3/27]
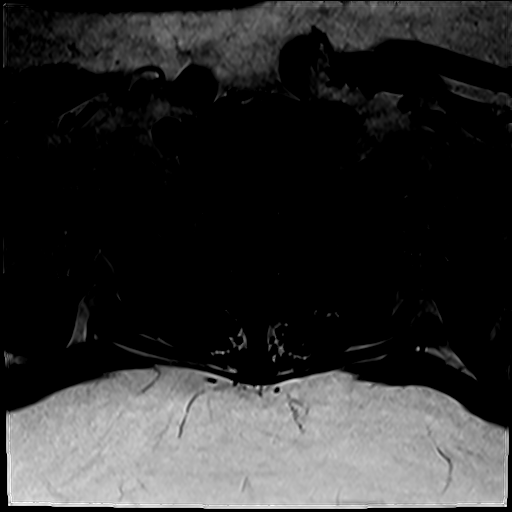
[im 9/27]
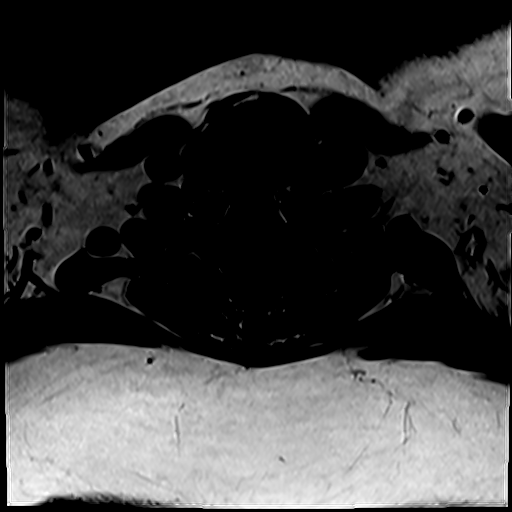
[im 12/27]
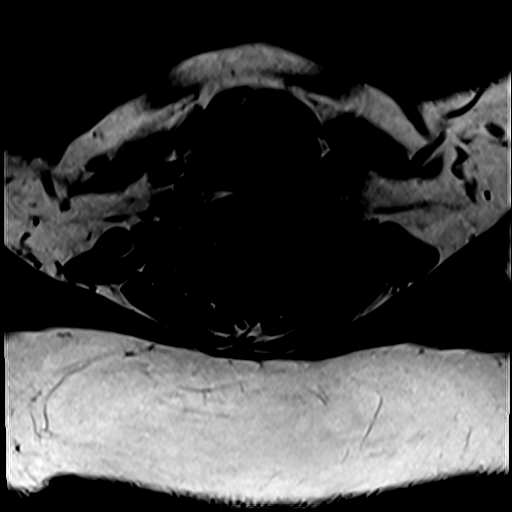
[im 15/27]
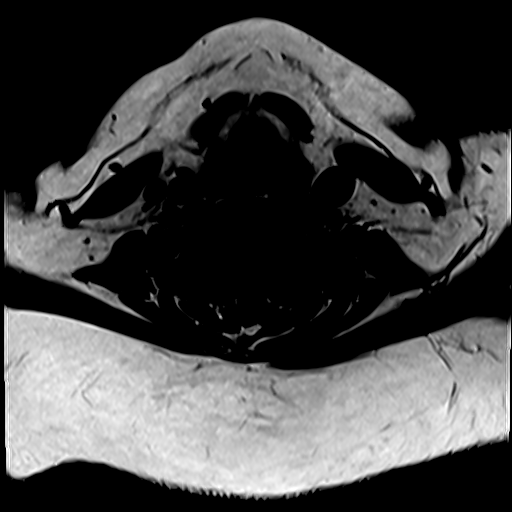
[im 18/27]
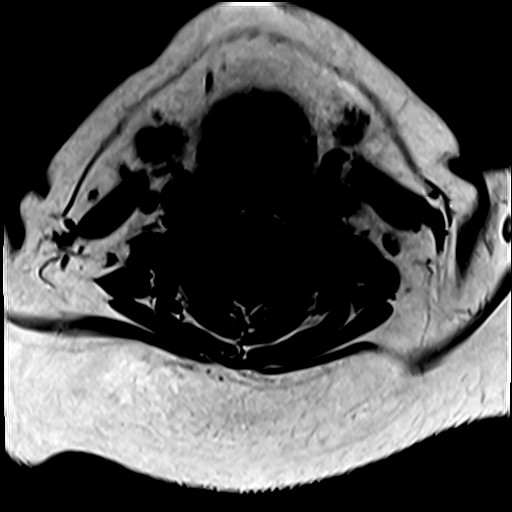
[im 24/27]
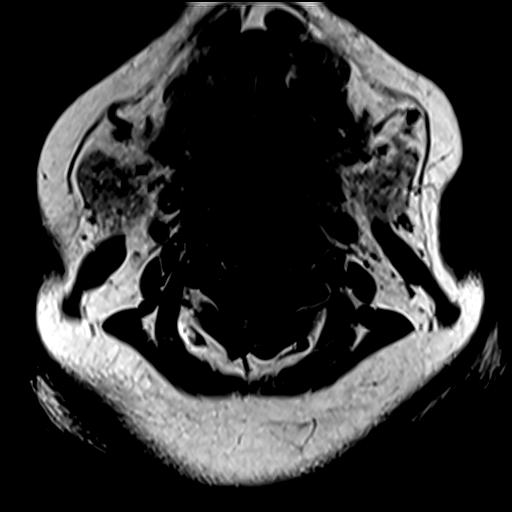
[im 27/27]
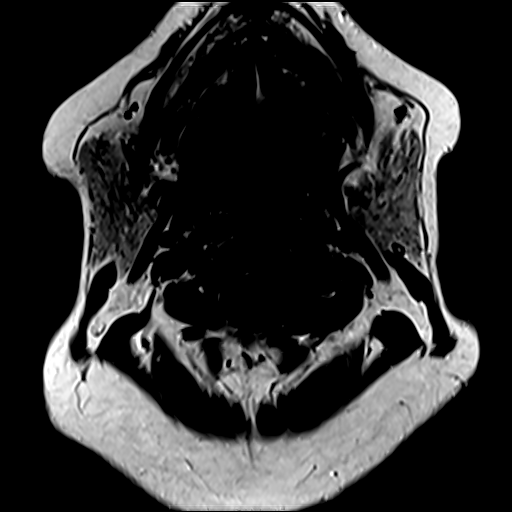

[35 of 48 positions shown; findings below may reference images not displayed]

FINDINGS: Mild intermittent motion degradation.

Alignment: Straightening of the expected cervical lordosis. Trace
C7-T1 grade 1 anterolisthesis.

Vertebrae: Vertebral body height is maintained. No significant
marrow edema or focal suspicious osseous lesion. Multilevel ventral
osteophytes, most prominent at C5-C6 and C6-C7.

Cord: No signal abnormality identified within the cervical spinal
cord.

Posterior Fossa, vertebral arteries, paraspinal tissues: Posterior
fossa better assessed on same-day brain MRI. Flow voids preserved
within the imaged cervical vertebral arteries. Paraspinal soft
tissues unremarkable.

Disc levels:

Multilevel disc degeneration. Disc degeneration is greatest at
C7-T1, T1-T2, T2-T3 and T3-T4 (mild-to-moderate at these levels).

C2-C3: No significant disc herniation or stenosis.

C3-C4: Facet arthrosis (greater on the right). Small right facet
joint effusion. No significant disc herniation or spinal canal
stenosis. Mild right neural foraminal narrowing.

C4-C5: Shallow broad-based left center disc protrusion with
associated endplate spurring. Uncovertebral hypertrophy and facet
arthrosis. The disc protrusion mildly effaces the ventral thecal
sac, contacting the ventral aspect of the spinal cord. Mild right
neural foraminal narrowing.

C5-C6: Small central disc protrusion eccentric to the right. Mild
uncovertebral hypertrophy on the right. The disc protrusion mildly
effaces the ventral thecal sac and contacts the ventral spinal cord.
Mild relative right neural foraminal narrowing.

C6-C7: Slight disc bulge. No significant spinal canal or foraminal
stenosis.

C7-T1: Trace grade 1 anterolisthesis. Slight disc uncovering. Facet
arthrosis. No significant spinal canal stenosis. Mild left neural
foraminal narrowing.
IMPRESSION: Cervical and upper thoracic spondylosis, as outlined. No more than
mild spinal canal or neural foraminal narrowing at the imaged
levels. Disc degeneration is greatest at C6-C7, C7-T1, T1-T2, T2-T3
and T3-T4 (mild-to-moderate at these levels). Small degenerative
facet joint effusion on the right at C3-C4.

Nonspecific straightening of the expected cervical lordosis.

Minimal C7-T1 grade 1 anterolisthesis.

## 2024-05-20 NOTE — Telephone Encounter (Signed)
 Pt scheduled

## 2024-05-21 LAB — COLOGUARD: COLOGUARD: NEGATIVE

## 2024-06-08 ENCOUNTER — Other Ambulatory Visit: Payer: Self-pay | Admitting: Pharmacist

## 2024-06-08 DIAGNOSIS — G894 Chronic pain syndrome: Secondary | ICD-10-CM | POA: Diagnosis not present

## 2024-06-08 DIAGNOSIS — M25511 Pain in right shoulder: Secondary | ICD-10-CM | POA: Diagnosis not present

## 2024-06-08 DIAGNOSIS — M542 Cervicalgia: Secondary | ICD-10-CM | POA: Diagnosis not present

## 2024-06-08 DIAGNOSIS — M150 Primary generalized (osteo)arthritis: Secondary | ICD-10-CM | POA: Diagnosis not present

## 2024-06-08 DIAGNOSIS — G43001 Migraine without aura, not intractable, with status migrainosus: Secondary | ICD-10-CM | POA: Diagnosis not present

## 2024-06-08 DIAGNOSIS — G629 Polyneuropathy, unspecified: Secondary | ICD-10-CM | POA: Diagnosis not present

## 2024-06-08 DIAGNOSIS — M797 Fibromyalgia: Secondary | ICD-10-CM | POA: Diagnosis not present

## 2024-06-08 DIAGNOSIS — M519 Unspecified thoracic, thoracolumbar and lumbosacral intervertebral disc disorder: Secondary | ICD-10-CM | POA: Diagnosis not present

## 2024-06-08 NOTE — Progress Notes (Signed)
 Pharmacy Quality Measure Review  This patient is appearing on a report for the adherence measure for hypertension (ACEi/ARB) medications this calendar year.   Medication: losartan  Last fill date: 05/23/2024 for 90 day supply  I sent a new rxn 05/11/24 and this was dispensed to the patient on 05/23/24. No further work-up needed at this time.   Herlene Fleeta Morris, PharmD, JAQUELINE, CPP Clinical Pharmacist [facility] [phone]

## 2024-06-15 ENCOUNTER — Ambulatory Visit (INDEPENDENT_AMBULATORY_CARE_PROVIDER_SITE_OTHER): Admitting: Family Medicine

## 2024-06-15 VITALS — BP 137/79 | HR 55 | Ht 66.0 in | Wt 258.6 lb

## 2024-06-15 DIAGNOSIS — Z6841 Body Mass Index (BMI) 40.0 and over, adult: Secondary | ICD-10-CM

## 2024-06-15 DIAGNOSIS — E66813 Obesity, class 3: Secondary | ICD-10-CM | POA: Diagnosis not present

## 2024-06-15 DIAGNOSIS — E039 Hypothyroidism, unspecified: Secondary | ICD-10-CM | POA: Diagnosis not present

## 2024-06-15 DIAGNOSIS — I10 Essential (primary) hypertension: Secondary | ICD-10-CM | POA: Diagnosis not present

## 2024-06-15 DIAGNOSIS — E119 Type 2 diabetes mellitus without complications: Secondary | ICD-10-CM | POA: Diagnosis not present

## 2024-06-15 DIAGNOSIS — F32A Depression, unspecified: Secondary | ICD-10-CM

## 2024-06-15 NOTE — Progress Notes (Unsigned)
 Established Patient Office Visit  Subjective    Patient ID: Laura Reid, female    DOB: [birth year]  Age: 63 y.o. MRN: [id number]  CC:  Chief Complaint  Patient presents with   Medical Management of Chronic Issues    HPI Laura Reid presents for follow up of chronic med issues including hypertension and hypothyroidism. Patient reports med compliance and denies acute complaints.   Outpatient Encounter Medications as of 06/15/2024  Medication Sig   amLODipine  (NORVASC ) 10 MG tablet TAKE 1 TABLET BY MOUTH EVERY DAY   Docusate Sodium (DSS) 100 MG CAPS Take 1 capsule by mouth 2 (two) times daily.   gabapentin  (NEURONTIN ) 300 MG capsule Take by mouth.   hydrochlorothiazide  (HYDRODIURIL ) 25 MG tablet TAKE 1 TABLET (25 MG TOTAL) BY MOUTH DAILY.   levothyroxine  (SYNTHROID ) 25 MCG tablet TAKE 1 TABLET BY MOUTH EVERY DAY   losartan  (COZAAR ) 100 MG tablet Take 1 tablet (100 mg total) by mouth daily.   metoprolol  succinate (TOPROL -XL) 100 MG 24 hr tablet TAKE 1 TABLET BY MOUTH EVERY DAY WITH OR IMMEDIATELY FOLLOWING A MEAL   Multiple Vitamins-Minerals (THERA-M) TABS Take 1 tablet by mouth daily.   nortriptyline  (PAMELOR ) 25 MG capsule TAKE 1 CAPSULE BY MOUTH AT BEDTIME.   omeprazole  (PRILOSEC) 20 MG capsule TAKE 1 CAPSULE BY MOUTH EVERY DAY   ondansetron (ZOFRAN-ODT) 8 MG disintegrating tablet Take 8 mg by mouth every 8 (eight) hours as needed.   oxyCODONE-acetaminophen (PERCOCET) 10-325 MG tablet Take 1 tablet by mouth 4 (four) times daily as needed.   PARoxetine  (PAXIL ) 30 MG tablet TAKE 2 TABLETS BY MOUTH DAILY   tiZANidine (ZANAFLEX) 4 MG tablet Take 1/2 to 1 tablet up to three times daily as needed for muscle spasms   albuterol  (VENTOLIN  HFA) 108 (90 Base) MCG/ACT inhaler Inhale 2 puffs into the lungs every 6 (six) hours as needed for wheezing or shortness of breath. (Patient not taking: Reported on 06/15/2024)   hydrochlorothiazide  (HYDRODIURIL ) 25 MG tablet TAKE 1 TABLET (25 MG  TOTAL) BY MOUTH DAILY.   hydrochlorothiazide  (HYDRODIURIL ) 25 MG tablet TAKE 1 TABLET (25 MG TOTAL) BY MOUTH DAILY.   nitrofurantoin, macrocrystal-monohydrate, (MACROBID) 100 MG capsule Take 1 capsule (100 mg total) by mouth 2 (two) times daily. (Patient not taking: Reported on 06/15/2024)   sucralfate  (CARAFATE ) 1 g tablet Take 1 tablet (1 g total) by mouth 4 (four) times daily -  with meals and at bedtime. (Patient not taking: Reported on 03/30/2024)   vortioxetine  HBr (TRINTELLIX ) 20 MG TABS tablet Take 1 tablet (20 mg total) by mouth daily. (Patient not taking: Reported on 03/30/2024)   No facility-administered encounter medications on file as of 06/15/2024.    History reviewed. No pertinent past medical history.  History reviewed. No pertinent surgical history.  History reviewed. No pertinent family history.  Social History   Socioeconomic History   Marital status: Married    Spouse name: Not on file   Number of children: Not on file   Years of education: Not on file   Highest education level: Some college, no degree  Occupational History   Not on file  Tobacco Use   Smoking status: Never   Smokeless tobacco: Never  Vaping Use   Vaping status: Never Used  Substance and Sexual Activity   Alcohol use: Not Currently   Drug use: Not Currently   Sexual activity: Yes  Other Topics Concern   Not on file  Social History Narrative   Not on file  Social Drivers of Corporate Investment Banker Strain: Low Risk  (03/28/2024)   Overall Financial Resource Strain (CARDIA)    Difficulty of Paying Living Expenses: Not very hard  Food Insecurity: No Food Insecurity (03/28/2024)   Hunger Vital Sign    Worried About Running Out of Food in the Last Year: Never true    Ran Out of Food in the Last Year: Never true  Transportation Needs: No Transportation Needs (03/28/2024)   PRAPARE - Administrator, Civil Service (Medical): No    Lack of Transportation (Non-Medical): No   Physical Activity: Insufficiently Active (03/28/2024)   Exercise Vital Sign    Days of Exercise per Week: 1 day    Minutes of Exercise per Session: 60 min  Stress: No Stress Concern Present (03/28/2024)   [employer] of Occupational Health - Occupational Stress Questionnaire    Feeling of Stress: Only a little  Social Connections: Socially Integrated (03/28/2024)   Social Connection and Isolation Panel    Frequency of Communication with Friends and Family: More than three times a week    Frequency of Social Gatherings with Friends and Family: Three times a week    Attends Religious Services: More than 4 times per year    Active Member of Clubs or Organizations: Yes    Attends Banker Meetings: More than 4 times per year    Marital Status: Married  Catering Manager Violence: Not At Risk (12/17/2023)   Humiliation, Afraid, Rape, and Kick questionnaire    Fear of Current or Ex-Partner: No    Emotionally Abused: No    Physically Abused: No    Sexually Abused: No    Review of Systems  All other systems reviewed and are negative.       Objective    BP 137/79   Pulse (!) 55   Ht 5' 6 (1.676 m)   Wt 258 lb 9.6 oz (117.3 kg)   SpO2 90%   BMI 41.74 kg/m   Physical Exam Vitals and nursing note reviewed.  Constitutional:      General: She is not in acute distress.    Appearance: She is obese.  Cardiovascular:     Rate and Rhythm: Normal rate and regular rhythm.  Pulmonary:     Effort: Pulmonary effort is normal.     Breath sounds: Normal breath sounds.  Abdominal:     Palpations: Abdomen is soft.     Tenderness: There is no abdominal tenderness.  Musculoskeletal:     Right lower leg: No edema.     Left lower leg: No edema.  Neurological:     General: No focal deficit present.     Mental Status: She is alert and oriented to person, place, and time.  Psychiatric:        Mood and Affect: Mood normal.        Behavior: Behavior normal.          Assessment & Plan:   Essential hypertension  Hypothyroidism, unspecified type -     TSH + free T4  Type 2 diabetes mellitus without complication, without long-term current use of insulin (HCC)  Class 3 severe obesity due to excess calories with serious comorbidity and body mass index (BMI) of 40.0 to 44.9 in adult [facility])  Depression, unspecified depression type     Return in about 6 months (around 12/13/2024) for follow up.   Tanda Raguel SQUIBB, MD

## 2024-06-16 ENCOUNTER — Ambulatory Visit: Payer: Self-pay | Admitting: Family Medicine

## 2024-06-16 LAB — TSH+FREE T4
Free T4: 0.93 ng/dL (ref 0.82–1.77)
TSH: 3.98 u[IU]/mL (ref 0.450–4.500)

## 2024-06-17 ENCOUNTER — Encounter: Payer: Self-pay | Admitting: Family Medicine

## 2024-06-18 ENCOUNTER — Other Ambulatory Visit: Payer: Self-pay | Admitting: Family Medicine

## 2024-06-18 DIAGNOSIS — I10 Essential (primary) hypertension: Secondary | ICD-10-CM

## 2024-06-20 NOTE — Telephone Encounter (Signed)
 Requested Prescriptions  Pending Prescriptions Disp Refills   hydrochlorothiazide  (HYDRODIURIL ) 25 MG tablet [Pharmacy Med Name: HYDROCHLOROTHIAZIDE  25 MG TAB] 90 tablet 0    Sig: TAKE 1 TABLET (25 MG TOTAL) BY MOUTH DAILY.     Cardiovascular: Diuretics - Thiazide Passed - 06/20/2024  2:34 PM      Passed - Cr in normal range and within 180 days    Creatinine, Ser  Date Value Ref Range Status  03/30/2024 0.94 0.57 - 1.00 mg/dL Final         Passed - K in normal range and within 180 days    Potassium  Date Value Ref Range Status  03/30/2024 4.4 3.5 - 5.2 mmol/L Final         Passed - Na in normal range and within 180 days    Sodium  Date Value Ref Range Status  03/30/2024 141 134 - 144 mmol/L Final         Passed - Last BP in normal range    BP Readings from Last 1 Encounters:  06/15/24 137/79         Passed - Valid encounter within last 6 months    Recent Outpatient Visits           5 days ago Essential hypertension   [facility], MD   1 month ago Strep throat exposure   [facility], MD   2 months ago Other fatigue   [facility], MD   11 months ago Depression, unspecified depression type   [facility], MD   1 year ago Other fatigue   [facility], MD

## 2024-07-05 ENCOUNTER — Other Ambulatory Visit: Payer: Self-pay | Admitting: Family Medicine

## 2024-07-18 ENCOUNTER — Other Ambulatory Visit: Payer: Self-pay | Admitting: Family Medicine

## 2024-07-18 NOTE — Telephone Encounter (Signed)
 Pt was given 90 day supply 05/20/24

## 2024-07-20 ENCOUNTER — Other Ambulatory Visit: Payer: Self-pay | Admitting: Family Medicine

## 2024-07-20 ENCOUNTER — Ambulatory Visit: Payer: Self-pay | Admitting: Family Medicine

## 2024-07-20 MED ORDER — PAROXETINE HCL 30 MG PO TABS: 60.0000 mg | ORAL_TABLET | Freq: Every day | ORAL | 0 refills | Status: AC

## 2024-07-20 NOTE — Telephone Encounter (Signed)
 FYI Only or Action Required?: Action required by provider: medication refill request.  Patient was last seen in primary care on 06/15/2024 by Tanda Bleacher, MD.  Called Nurse Triage reporting Medication Refill.  Symptoms began several days ago.  Interventions attempted: Nothing.  Symptoms are: gradually worsening.  Triage Disposition: Call PCP now  Patient/caregiver understands and will follow disposition?:  YEs         Copied from CRM #[id number]. Topic: Clinical - Red Word Triage >> Jul 20, 2024 10:56 AM Treva T wrote: [facility] that prompted transfer to Nurse Triage: Pt spouse, Glendia calling, states pt has been without medication, PARoxetine  (PAXIL ) 30 MG tablet, and is now experiencing side effects.  Side effects are decrease in energy, not wanting to get out of bed, no thrive to function, increased depression.   Pt spouse ph. [phone] Reason for Disposition  [1] Prescription refill request for ESSENTIAL medicine (i.e., likelihood of harm to patient if not taken) AND [2] triager unable to refill per department policy  Answer Assessment - Initial Assessment Questions Called [facility] and spoke with staff member, Corean. Patient did receive the full 180 tablets on 05/20/24.   1. DRUG NAME: What medicine do you need to have refilled?     Paxil .  2. REFILLS REMAINING: How many refills are remaining? Notes: The label on the medicine or pill bottle will show how many refills are remaining. If there are no refills remaining, then a renewal may be needed.     0.  3. EXPIRATION DATE: What is the expiration date? Note: The label states when the prescription will expire, and thus can no longer be refilled.)     05/20/25  4. PRESCRIBER: Who prescribed it? Note: The prescribing doctor or group is responsible for refill approvals..     Dr Tanda.  5. PHARMACY: Have you contacted your pharmacy (drugstore)? Note: Some pharmacies will contact the doctor (or  NP/PA).      Yes, pharmacy states there are no refills. Patient states there are none at her home and she is unsure if the pharmacy gave her the 90 day supply or less.  6. SYMPTOMS: Do you have any symptoms?     Fatigue/low energy, depression, don't want to do anything and just don't care. Denies any suicidal ideation or plans.  Protocols used: Medication Refill and Renewal Call-A-AH

## 2024-08-15 NOTE — Progress Notes (Addendum)
 Laura Reid                                          MRN: [id number]   08/15/2024   The [facility] Quality Team Specialist reviewed this patient medical record for the purposes of chart review for care gap closure. The following were reviewed: abstraction for care gap closure-glycemic status assessment.  Laura Reid                                          MRN: [id number]   08/25/2024   The [facility] Quality Team Specialist reviewed this patient medical record for the purposes of chart review for care gap closure. The following were reviewed: chart review for care gap closure-kidney health evaluation for diabetes:eGFR  and uACR.    [facility]I Quality Team    [facility] Quality Team

## 2024-08-20 ENCOUNTER — Other Ambulatory Visit: Payer: Self-pay | Admitting: Family Medicine

## 2024-08-20 DIAGNOSIS — K219 Gastro-esophageal reflux disease without esophagitis: Secondary | ICD-10-CM

## 2024-09-07 ENCOUNTER — Encounter: Payer: Self-pay | Admitting: Family Medicine

## 2024-09-08 ENCOUNTER — Other Ambulatory Visit: Payer: Self-pay | Admitting: Family Medicine

## 2024-09-08 MED ORDER — LEVOTHYROXINE SODIUM 25 MCG PO TABS: 25.0000 ug | ORAL_TABLET | Freq: Every day | ORAL | 0 refills | Status: AC

## 2024-09-12 ENCOUNTER — Other Ambulatory Visit: Payer: Self-pay | Admitting: Family Medicine

## 2024-09-12 DIAGNOSIS — I10 Essential (primary) hypertension: Secondary | ICD-10-CM

## 2024-12-29 ENCOUNTER — Ambulatory Visit
